# Patient Record
Sex: Female | Born: 1945 | Race: White | Hispanic: No | Marital: Married | Smoking: Never smoker
Health system: Southern US, Community
[De-identification: ages and names within clinical notes are randomized; demographics above are authoritative.]

## PROBLEM LIST (undated history)

## (undated) DIAGNOSIS — K449 Diaphragmatic hernia without obstruction or gangrene: Secondary | ICD-10-CM

## (undated) DIAGNOSIS — K5790 Diverticulosis of intestine, part unspecified, without perforation or abscess without bleeding: Secondary | ICD-10-CM

## (undated) DIAGNOSIS — M1990 Unspecified osteoarthritis, unspecified site: Secondary | ICD-10-CM

## (undated) DIAGNOSIS — K759 Inflammatory liver disease, unspecified: Secondary | ICD-10-CM

## (undated) DIAGNOSIS — K8020 Calculus of gallbladder without cholecystitis without obstruction: Secondary | ICD-10-CM

## (undated) DIAGNOSIS — M069 Rheumatoid arthritis, unspecified: Secondary | ICD-10-CM

## (undated) DIAGNOSIS — T7840XA Allergy, unspecified, initial encounter: Secondary | ICD-10-CM

## (undated) DIAGNOSIS — H409 Unspecified glaucoma: Secondary | ICD-10-CM

## (undated) DIAGNOSIS — K219 Gastro-esophageal reflux disease without esophagitis: Secondary | ICD-10-CM

## (undated) DIAGNOSIS — L4050 Arthropathic psoriasis, unspecified: Secondary | ICD-10-CM

## (undated) DIAGNOSIS — H269 Unspecified cataract: Secondary | ICD-10-CM

## (undated) HISTORY — DX: Allergy, unspecified, initial encounter: T78.40XA

## (undated) HISTORY — DX: Diverticulosis of intestine, part unspecified, without perforation or abscess without bleeding: K57.90

## (undated) HISTORY — DX: Arthropathic psoriasis, unspecified: L40.50

## (undated) HISTORY — DX: Inflammatory liver disease, unspecified: K75.9

## (undated) HISTORY — DX: Rheumatoid arthritis, unspecified: M06.9

## (undated) HISTORY — DX: Unspecified cataract: H26.9

## (undated) HISTORY — DX: Unspecified osteoarthritis, unspecified site: M19.90

## (undated) HISTORY — PX: DILATION AND CURETTAGE OF UTERUS: SHX78

## (undated) HISTORY — PX: COLONOSCOPY: SHX174

## (undated) HISTORY — PX: INTRAOCULAR LENS INSERTION: SHX110

## (undated) HISTORY — DX: Diaphragmatic hernia without obstruction or gangrene: K44.9

## (undated) HISTORY — DX: Gastro-esophageal reflux disease without esophagitis: K21.9

## (undated) HISTORY — PX: WISDOM TOOTH EXTRACTION: SHX21

## (undated) HISTORY — DX: Unspecified glaucoma: H40.9

## (undated) HISTORY — PX: BUNIONECTOMY: SHX129

## (undated) HISTORY — PX: EYE SURGERY: SHX253

## (undated) HISTORY — DX: Calculus of gallbladder without cholecystitis without obstruction: K80.20

---

## 1984-06-24 HISTORY — PX: TIBIA FRACTURE SURGERY: SHX806

## 1997-11-10 ENCOUNTER — Other Ambulatory Visit: Admission: RE | Admit: 1997-11-10 | Discharge: 1997-11-10 | Payer: Self-pay | Admitting: Gynecology

## 1999-03-20 ENCOUNTER — Other Ambulatory Visit: Admission: RE | Admit: 1999-03-20 | Discharge: 1999-03-20 | Payer: Self-pay | Admitting: Gynecology

## 2004-01-05 ENCOUNTER — Other Ambulatory Visit: Admission: RE | Admit: 2004-01-05 | Discharge: 2004-01-05 | Payer: Self-pay | Admitting: Gynecology

## 2004-10-02 ENCOUNTER — Ambulatory Visit: Payer: Self-pay | Admitting: Gastroenterology

## 2004-10-22 ENCOUNTER — Ambulatory Visit: Payer: Self-pay | Admitting: Gastroenterology

## 2009-01-26 ENCOUNTER — Encounter: Admission: RE | Admit: 2009-01-26 | Discharge: 2009-01-26 | Payer: Self-pay | Admitting: Gynecology

## 2009-06-24 DEATH — deceased

## 2010-04-04 ENCOUNTER — Encounter: Admission: RE | Admit: 2010-04-04 | Discharge: 2010-04-04 | Payer: Self-pay | Admitting: Gynecology

## 2010-12-24 ENCOUNTER — Emergency Department (HOSPITAL_COMMUNITY): Payer: BC Managed Care – PPO

## 2010-12-24 ENCOUNTER — Emergency Department (HOSPITAL_COMMUNITY)
Admission: EM | Admit: 2010-12-24 | Discharge: 2010-12-25 | Disposition: A | Discharge status: Home / Self Care | Payer: BC Managed Care – PPO | Attending: Emergency Medicine | Admitting: Emergency Medicine

## 2010-12-24 DIAGNOSIS — R61 Generalized hyperhidrosis: Secondary | ICD-10-CM | POA: Insufficient documentation

## 2010-12-24 DIAGNOSIS — R630 Anorexia: Secondary | ICD-10-CM | POA: Insufficient documentation

## 2010-12-24 DIAGNOSIS — R51 Headache: Secondary | ICD-10-CM | POA: Insufficient documentation

## 2010-12-24 DIAGNOSIS — R05 Cough: Secondary | ICD-10-CM | POA: Insufficient documentation

## 2010-12-24 DIAGNOSIS — R059 Cough, unspecified: Secondary | ICD-10-CM | POA: Insufficient documentation

## 2010-12-24 DIAGNOSIS — R42 Dizziness and giddiness: Secondary | ICD-10-CM | POA: Insufficient documentation

## 2010-12-24 DIAGNOSIS — M2540 Effusion, unspecified joint: Secondary | ICD-10-CM | POA: Insufficient documentation

## 2010-12-24 DIAGNOSIS — IMO0001 Reserved for inherently not codable concepts without codable children: Secondary | ICD-10-CM | POA: Insufficient documentation

## 2010-12-24 DIAGNOSIS — M2560 Stiffness of unspecified joint, not elsewhere classified: Secondary | ICD-10-CM | POA: Insufficient documentation

## 2010-12-24 DIAGNOSIS — R5381 Other malaise: Secondary | ICD-10-CM | POA: Insufficient documentation

## 2010-12-24 DIAGNOSIS — T148 Other injury of unspecified body region: Secondary | ICD-10-CM | POA: Insufficient documentation

## 2010-12-24 DIAGNOSIS — W57XXXA Bitten or stung by nonvenomous insect and other nonvenomous arthropods, initial encounter: Secondary | ICD-10-CM | POA: Insufficient documentation

## 2010-12-24 DIAGNOSIS — R071 Chest pain on breathing: Secondary | ICD-10-CM | POA: Insufficient documentation

## 2010-12-24 DIAGNOSIS — R509 Fever, unspecified: Secondary | ICD-10-CM | POA: Insufficient documentation

## 2010-12-24 LAB — CBC
MCHC: 33.8 g/dL (ref 30.0–36.0)
Platelets: 229 10*3/uL (ref 150–400)
RDW: 12.5 % (ref 11.5–15.5)
WBC: 11.2 10*3/uL — ABNORMAL HIGH (ref 4.0–10.5)

## 2010-12-24 LAB — DIFFERENTIAL
Basophils Absolute: 0.1 10*3/uL (ref 0.0–0.1)
Basophils Relative: 1 % (ref 0–1)
Eosinophils Relative: 3 % (ref 0–5)
Monocytes Absolute: 1.2 10*3/uL — ABNORMAL HIGH (ref 0.1–1.0)
Neutro Abs: 6.8 10*3/uL (ref 1.7–7.7)

## 2010-12-24 LAB — BASIC METABOLIC PANEL
BUN: 15 mg/dL (ref 6–23)
Calcium: 8.8 mg/dL (ref 8.4–10.5)
Creatinine, Ser: 0.67 mg/dL (ref 0.50–1.10)
GFR calc non Af Amer: >60 mL/min (ref >60)
Glucose, Bld: 95 mg/dL (ref 70–99)

## 2010-12-24 LAB — URINE MICROSCOPIC-ADD ON

## 2010-12-24 LAB — CK TOTAL AND CKMB (NOT AT ARMC)
CK, MB: 2.7 ng/mL (ref 0.3–4.0)
Total CK: 75 U/L (ref 7–177)

## 2010-12-24 LAB — URINALYSIS, ROUTINE W REFLEX MICROSCOPIC
Bilirubin Urine: NEGATIVE
Ketones, ur: NEGATIVE mg/dL
Protein, ur: NEGATIVE mg/dL
Specific Gravity, Urine: 1.028 (ref 1.005–1.030)
Urobilinogen, UA: 0.2 mg/dL (ref 0.0–1.0)

## 2010-12-25 LAB — URINE CULTURE

## 2011-07-01 DIAGNOSIS — M159 Polyosteoarthritis, unspecified: Secondary | ICD-10-CM | POA: Diagnosis not present

## 2011-07-01 DIAGNOSIS — L4050 Arthropathic psoriasis, unspecified: Secondary | ICD-10-CM | POA: Diagnosis not present

## 2011-07-01 DIAGNOSIS — L408 Other psoriasis: Secondary | ICD-10-CM | POA: Diagnosis not present

## 2011-07-23 DIAGNOSIS — L4050 Arthropathic psoriasis, unspecified: Secondary | ICD-10-CM | POA: Diagnosis not present

## 2011-08-13 DIAGNOSIS — L259 Unspecified contact dermatitis, unspecified cause: Secondary | ICD-10-CM | POA: Diagnosis not present

## 2011-08-13 DIAGNOSIS — L981 Factitial dermatitis: Secondary | ICD-10-CM | POA: Diagnosis not present

## 2011-09-05 DIAGNOSIS — L4050 Arthropathic psoriasis, unspecified: Secondary | ICD-10-CM | POA: Diagnosis not present

## 2011-10-15 DIAGNOSIS — L4050 Arthropathic psoriasis, unspecified: Secondary | ICD-10-CM | POA: Diagnosis not present

## 2011-10-15 DIAGNOSIS — L408 Other psoriasis: Secondary | ICD-10-CM | POA: Diagnosis not present

## 2011-10-15 DIAGNOSIS — M159 Polyosteoarthritis, unspecified: Secondary | ICD-10-CM | POA: Diagnosis not present

## 2011-11-26 DIAGNOSIS — L4050 Arthropathic psoriasis, unspecified: Secondary | ICD-10-CM | POA: Diagnosis not present

## 2012-01-07 DIAGNOSIS — L4050 Arthropathic psoriasis, unspecified: Secondary | ICD-10-CM | POA: Diagnosis not present

## 2012-02-18 DIAGNOSIS — L4050 Arthropathic psoriasis, unspecified: Secondary | ICD-10-CM | POA: Diagnosis not present

## 2012-03-10 DIAGNOSIS — M719 Bursopathy, unspecified: Secondary | ICD-10-CM | POA: Diagnosis not present

## 2012-03-10 DIAGNOSIS — L4050 Arthropathic psoriasis, unspecified: Secondary | ICD-10-CM | POA: Diagnosis not present

## 2012-03-10 DIAGNOSIS — M159 Polyosteoarthritis, unspecified: Secondary | ICD-10-CM | POA: Diagnosis not present

## 2012-03-10 DIAGNOSIS — M67919 Unspecified disorder of synovium and tendon, unspecified shoulder: Secondary | ICD-10-CM | POA: Diagnosis not present

## 2012-03-10 DIAGNOSIS — L408 Other psoriasis: Secondary | ICD-10-CM | POA: Diagnosis not present

## 2012-03-31 DIAGNOSIS — M069 Rheumatoid arthritis, unspecified: Secondary | ICD-10-CM | POA: Diagnosis not present

## 2012-03-31 DIAGNOSIS — Z23 Encounter for immunization: Secondary | ICD-10-CM | POA: Diagnosis not present

## 2012-05-12 DIAGNOSIS — M2550 Pain in unspecified joint: Secondary | ICD-10-CM | POA: Diagnosis not present

## 2012-05-12 DIAGNOSIS — L4050 Arthropathic psoriasis, unspecified: Secondary | ICD-10-CM | POA: Diagnosis not present

## 2012-06-23 DIAGNOSIS — L4050 Arthropathic psoriasis, unspecified: Secondary | ICD-10-CM | POA: Diagnosis not present

## 2012-07-07 DIAGNOSIS — M159 Polyosteoarthritis, unspecified: Secondary | ICD-10-CM | POA: Diagnosis not present

## 2012-07-07 DIAGNOSIS — M79609 Pain in unspecified limb: Secondary | ICD-10-CM | POA: Diagnosis not present

## 2012-07-07 DIAGNOSIS — L4050 Arthropathic psoriasis, unspecified: Secondary | ICD-10-CM | POA: Diagnosis not present

## 2012-07-07 DIAGNOSIS — M67919 Unspecified disorder of synovium and tendon, unspecified shoulder: Secondary | ICD-10-CM | POA: Diagnosis not present

## 2012-07-07 DIAGNOSIS — L408 Other psoriasis: Secondary | ICD-10-CM | POA: Diagnosis not present

## 2012-07-07 DIAGNOSIS — M719 Bursopathy, unspecified: Secondary | ICD-10-CM | POA: Diagnosis not present

## 2012-08-04 DIAGNOSIS — L4050 Arthropathic psoriasis, unspecified: Secondary | ICD-10-CM | POA: Diagnosis not present

## 2012-08-27 DIAGNOSIS — H2510 Age-related nuclear cataract, unspecified eye: Secondary | ICD-10-CM | POA: Diagnosis not present

## 2012-09-08 DIAGNOSIS — H2510 Age-related nuclear cataract, unspecified eye: Secondary | ICD-10-CM | POA: Diagnosis not present

## 2012-09-10 DIAGNOSIS — H269 Unspecified cataract: Secondary | ICD-10-CM | POA: Diagnosis not present

## 2012-09-10 DIAGNOSIS — H2510 Age-related nuclear cataract, unspecified eye: Secondary | ICD-10-CM | POA: Diagnosis not present

## 2012-09-10 DIAGNOSIS — IMO0002 Reserved for concepts with insufficient information to code with codable children: Secondary | ICD-10-CM | POA: Diagnosis not present

## 2012-10-05 DIAGNOSIS — L4050 Arthropathic psoriasis, unspecified: Secondary | ICD-10-CM | POA: Diagnosis not present

## 2012-11-17 DIAGNOSIS — L4050 Arthropathic psoriasis, unspecified: Secondary | ICD-10-CM | POA: Diagnosis not present

## 2013-01-05 DIAGNOSIS — L4050 Arthropathic psoriasis, unspecified: Secondary | ICD-10-CM | POA: Diagnosis not present

## 2013-01-11 DIAGNOSIS — M159 Polyosteoarthritis, unspecified: Secondary | ICD-10-CM | POA: Diagnosis not present

## 2013-01-11 DIAGNOSIS — M719 Bursopathy, unspecified: Secondary | ICD-10-CM | POA: Diagnosis not present

## 2013-01-11 DIAGNOSIS — L4050 Arthropathic psoriasis, unspecified: Secondary | ICD-10-CM | POA: Diagnosis not present

## 2013-01-11 DIAGNOSIS — M67919 Unspecified disorder of synovium and tendon, unspecified shoulder: Secondary | ICD-10-CM | POA: Diagnosis not present

## 2013-01-11 DIAGNOSIS — L408 Other psoriasis: Secondary | ICD-10-CM | POA: Diagnosis not present

## 2013-01-19 DIAGNOSIS — B079 Viral wart, unspecified: Secondary | ICD-10-CM | POA: Diagnosis not present

## 2013-02-15 DIAGNOSIS — L4050 Arthropathic psoriasis, unspecified: Secondary | ICD-10-CM | POA: Diagnosis not present

## 2013-03-04 DIAGNOSIS — M899 Disorder of bone, unspecified: Secondary | ICD-10-CM | POA: Diagnosis not present

## 2013-03-04 DIAGNOSIS — Z1231 Encounter for screening mammogram for malignant neoplasm of breast: Secondary | ICD-10-CM | POA: Diagnosis not present

## 2013-03-18 DIAGNOSIS — Z1289 Encounter for screening for malignant neoplasm of other sites: Secondary | ICD-10-CM | POA: Diagnosis not present

## 2013-03-18 DIAGNOSIS — Z1212 Encounter for screening for malignant neoplasm of rectum: Secondary | ICD-10-CM | POA: Diagnosis not present

## 2013-03-30 DIAGNOSIS — Z23 Encounter for immunization: Secondary | ICD-10-CM | POA: Diagnosis not present

## 2013-03-30 DIAGNOSIS — L4050 Arthropathic psoriasis, unspecified: Secondary | ICD-10-CM | POA: Diagnosis not present

## 2013-05-04 DIAGNOSIS — L4050 Arthropathic psoriasis, unspecified: Secondary | ICD-10-CM | POA: Diagnosis not present

## 2013-06-08 DIAGNOSIS — L4050 Arthropathic psoriasis, unspecified: Secondary | ICD-10-CM | POA: Diagnosis not present

## 2013-06-22 DIAGNOSIS — L408 Other psoriasis: Secondary | ICD-10-CM | POA: Diagnosis not present

## 2013-06-22 DIAGNOSIS — M67919 Unspecified disorder of synovium and tendon, unspecified shoulder: Secondary | ICD-10-CM | POA: Diagnosis not present

## 2013-06-22 DIAGNOSIS — L4050 Arthropathic psoriasis, unspecified: Secondary | ICD-10-CM | POA: Diagnosis not present

## 2013-06-22 DIAGNOSIS — M159 Polyosteoarthritis, unspecified: Secondary | ICD-10-CM | POA: Diagnosis not present

## 2013-07-13 DIAGNOSIS — L4050 Arthropathic psoriasis, unspecified: Secondary | ICD-10-CM | POA: Diagnosis not present

## 2013-08-31 DIAGNOSIS — L4050 Arthropathic psoriasis, unspecified: Secondary | ICD-10-CM | POA: Diagnosis not present

## 2013-10-19 DIAGNOSIS — M159 Polyosteoarthritis, unspecified: Secondary | ICD-10-CM | POA: Diagnosis not present

## 2013-10-19 DIAGNOSIS — M719 Bursopathy, unspecified: Secondary | ICD-10-CM | POA: Diagnosis not present

## 2013-10-19 DIAGNOSIS — L408 Other psoriasis: Secondary | ICD-10-CM | POA: Diagnosis not present

## 2013-10-19 DIAGNOSIS — M67919 Unspecified disorder of synovium and tendon, unspecified shoulder: Secondary | ICD-10-CM | POA: Diagnosis not present

## 2013-10-19 DIAGNOSIS — L4050 Arthropathic psoriasis, unspecified: Secondary | ICD-10-CM | POA: Diagnosis not present

## 2013-10-21 DIAGNOSIS — L4050 Arthropathic psoriasis, unspecified: Secondary | ICD-10-CM | POA: Diagnosis not present

## 2013-11-30 DIAGNOSIS — L4050 Arthropathic psoriasis, unspecified: Secondary | ICD-10-CM | POA: Diagnosis not present

## 2014-01-11 DIAGNOSIS — L4050 Arthropathic psoriasis, unspecified: Secondary | ICD-10-CM | POA: Diagnosis not present

## 2014-02-15 DIAGNOSIS — L4050 Arthropathic psoriasis, unspecified: Secondary | ICD-10-CM | POA: Diagnosis not present

## 2014-02-15 DIAGNOSIS — L408 Other psoriasis: Secondary | ICD-10-CM | POA: Diagnosis not present

## 2014-02-15 DIAGNOSIS — M67919 Unspecified disorder of synovium and tendon, unspecified shoulder: Secondary | ICD-10-CM | POA: Diagnosis not present

## 2014-02-15 DIAGNOSIS — M719 Bursopathy, unspecified: Secondary | ICD-10-CM | POA: Diagnosis not present

## 2014-02-15 DIAGNOSIS — M159 Polyosteoarthritis, unspecified: Secondary | ICD-10-CM | POA: Diagnosis not present

## 2014-02-22 DIAGNOSIS — L4050 Arthropathic psoriasis, unspecified: Secondary | ICD-10-CM | POA: Diagnosis not present

## 2014-03-16 DIAGNOSIS — Z1231 Encounter for screening mammogram for malignant neoplasm of breast: Secondary | ICD-10-CM | POA: Diagnosis not present

## 2014-03-21 DIAGNOSIS — N951 Menopausal and female climacteric states: Secondary | ICD-10-CM | POA: Diagnosis not present

## 2014-03-21 DIAGNOSIS — Z124 Encounter for screening for malignant neoplasm of cervix: Secondary | ICD-10-CM | POA: Diagnosis not present

## 2014-03-21 DIAGNOSIS — M810 Age-related osteoporosis without current pathological fracture: Secondary | ICD-10-CM | POA: Diagnosis not present

## 2014-03-21 DIAGNOSIS — Z1212 Encounter for screening for malignant neoplasm of rectum: Secondary | ICD-10-CM | POA: Diagnosis not present

## 2014-04-06 DIAGNOSIS — L4050 Arthropathic psoriasis, unspecified: Secondary | ICD-10-CM | POA: Diagnosis not present

## 2014-04-06 DIAGNOSIS — Z23 Encounter for immunization: Secondary | ICD-10-CM | POA: Diagnosis not present

## 2014-05-18 DIAGNOSIS — L4050 Arthropathic psoriasis, unspecified: Secondary | ICD-10-CM | POA: Diagnosis not present

## 2014-05-31 DIAGNOSIS — L720 Epidermal cyst: Secondary | ICD-10-CM | POA: Diagnosis not present

## 2014-05-31 DIAGNOSIS — L300 Nummular dermatitis: Secondary | ICD-10-CM | POA: Diagnosis not present

## 2014-05-31 DIAGNOSIS — D485 Neoplasm of uncertain behavior of skin: Secondary | ICD-10-CM | POA: Diagnosis not present

## 2014-05-31 DIAGNOSIS — L820 Inflamed seborrheic keratosis: Secondary | ICD-10-CM | POA: Diagnosis not present

## 2014-06-22 DIAGNOSIS — M150 Primary generalized (osteo)arthritis: Secondary | ICD-10-CM | POA: Diagnosis not present

## 2014-06-22 DIAGNOSIS — M79643 Pain in unspecified hand: Secondary | ICD-10-CM | POA: Diagnosis not present

## 2014-06-22 DIAGNOSIS — L4050 Arthropathic psoriasis, unspecified: Secondary | ICD-10-CM | POA: Diagnosis not present

## 2014-06-22 DIAGNOSIS — L409 Psoriasis, unspecified: Secondary | ICD-10-CM | POA: Diagnosis not present

## 2014-06-22 DIAGNOSIS — M899 Disorder of bone, unspecified: Secondary | ICD-10-CM | POA: Diagnosis not present

## 2014-06-28 DIAGNOSIS — L4050 Arthropathic psoriasis, unspecified: Secondary | ICD-10-CM | POA: Diagnosis not present

## 2014-07-12 DIAGNOSIS — H2511 Age-related nuclear cataract, right eye: Secondary | ICD-10-CM | POA: Diagnosis not present

## 2014-07-12 DIAGNOSIS — H18411 Arcus senilis, right eye: Secondary | ICD-10-CM | POA: Diagnosis not present

## 2014-07-12 DIAGNOSIS — H02839 Dermatochalasis of unspecified eye, unspecified eyelid: Secondary | ICD-10-CM | POA: Diagnosis not present

## 2014-07-12 DIAGNOSIS — Z961 Presence of intraocular lens: Secondary | ICD-10-CM | POA: Diagnosis not present

## 2014-07-19 DIAGNOSIS — L821 Other seborrheic keratosis: Secondary | ICD-10-CM | POA: Diagnosis not present

## 2014-07-19 DIAGNOSIS — L720 Epidermal cyst: Secondary | ICD-10-CM | POA: Diagnosis not present

## 2014-07-19 DIAGNOSIS — L570 Actinic keratosis: Secondary | ICD-10-CM | POA: Diagnosis not present

## 2014-07-19 DIAGNOSIS — L814 Other melanin hyperpigmentation: Secondary | ICD-10-CM | POA: Diagnosis not present

## 2014-08-09 DIAGNOSIS — L4050 Arthropathic psoriasis, unspecified: Secondary | ICD-10-CM | POA: Diagnosis not present

## 2014-09-05 DIAGNOSIS — H2589 Other age-related cataract: Secondary | ICD-10-CM | POA: Diagnosis not present

## 2014-09-05 DIAGNOSIS — H25811 Combined forms of age-related cataract, right eye: Secondary | ICD-10-CM | POA: Diagnosis not present

## 2014-09-05 DIAGNOSIS — H2511 Age-related nuclear cataract, right eye: Secondary | ICD-10-CM | POA: Diagnosis not present

## 2014-09-20 DIAGNOSIS — L4050 Arthropathic psoriasis, unspecified: Secondary | ICD-10-CM | POA: Diagnosis not present

## 2014-09-20 DIAGNOSIS — M150 Primary generalized (osteo)arthritis: Secondary | ICD-10-CM | POA: Diagnosis not present

## 2014-10-18 DIAGNOSIS — M150 Primary generalized (osteo)arthritis: Secondary | ICD-10-CM | POA: Diagnosis not present

## 2014-10-18 DIAGNOSIS — L4050 Arthropathic psoriasis, unspecified: Secondary | ICD-10-CM | POA: Diagnosis not present

## 2014-10-18 DIAGNOSIS — L409 Psoriasis, unspecified: Secondary | ICD-10-CM | POA: Diagnosis not present

## 2014-11-01 DIAGNOSIS — L4050 Arthropathic psoriasis, unspecified: Secondary | ICD-10-CM | POA: Diagnosis not present

## 2014-12-12 DIAGNOSIS — L4050 Arthropathic psoriasis, unspecified: Secondary | ICD-10-CM | POA: Diagnosis not present

## 2015-01-24 DIAGNOSIS — L4050 Arthropathic psoriasis, unspecified: Secondary | ICD-10-CM | POA: Diagnosis not present

## 2015-03-02 DIAGNOSIS — L4050 Arthropathic psoriasis, unspecified: Secondary | ICD-10-CM | POA: Diagnosis not present

## 2015-03-02 DIAGNOSIS — M25532 Pain in left wrist: Secondary | ICD-10-CM | POA: Diagnosis not present

## 2015-03-02 DIAGNOSIS — L409 Psoriasis, unspecified: Secondary | ICD-10-CM | POA: Diagnosis not present

## 2015-03-02 DIAGNOSIS — W19XXXA Unspecified fall, initial encounter: Secondary | ICD-10-CM | POA: Diagnosis not present

## 2015-03-02 DIAGNOSIS — M25522 Pain in left elbow: Secondary | ICD-10-CM | POA: Diagnosis not present

## 2015-03-02 DIAGNOSIS — M25531 Pain in right wrist: Secondary | ICD-10-CM | POA: Diagnosis not present

## 2015-03-02 DIAGNOSIS — Z79899 Other long term (current) drug therapy: Secondary | ICD-10-CM | POA: Diagnosis not present

## 2015-03-02 DIAGNOSIS — Y92009 Unspecified place in unspecified non-institutional (private) residence as the place of occurrence of the external cause: Secondary | ICD-10-CM | POA: Diagnosis not present

## 2015-03-02 DIAGNOSIS — M150 Primary generalized (osteo)arthritis: Secondary | ICD-10-CM | POA: Diagnosis not present

## 2015-03-14 ENCOUNTER — Emergency Department (HOSPITAL_COMMUNITY)
Admission: EM | Admit: 2015-03-14 | Discharge: 2015-03-14 | Disposition: A | Discharge status: Home / Self Care | Payer: Medicare Other | Attending: Emergency Medicine | Admitting: Emergency Medicine

## 2015-03-14 ENCOUNTER — Encounter (HOSPITAL_COMMUNITY): Payer: Self-pay | Admitting: Emergency Medicine

## 2015-03-14 ENCOUNTER — Emergency Department (HOSPITAL_COMMUNITY): Payer: Medicare Other

## 2015-03-14 DIAGNOSIS — K449 Diaphragmatic hernia without obstruction or gangrene: Secondary | ICD-10-CM | POA: Diagnosis not present

## 2015-03-14 DIAGNOSIS — R1013 Epigastric pain: Secondary | ICD-10-CM | POA: Diagnosis present

## 2015-03-14 DIAGNOSIS — R1011 Right upper quadrant pain: Secondary | ICD-10-CM

## 2015-03-14 DIAGNOSIS — M1990 Unspecified osteoarthritis, unspecified site: Secondary | ICD-10-CM | POA: Diagnosis not present

## 2015-03-14 DIAGNOSIS — K8020 Calculus of gallbladder without cholecystitis without obstruction: Secondary | ICD-10-CM | POA: Diagnosis not present

## 2015-03-14 DIAGNOSIS — Z791 Long term (current) use of non-steroidal anti-inflammatories (NSAID): Secondary | ICD-10-CM | POA: Insufficient documentation

## 2015-03-14 LAB — CBC WITH DIFFERENTIAL/PLATELET
BASOS ABS: 0 10*3/uL (ref 0.0–0.1)
BASOS PCT: 0 %
EOS ABS: 0.1 10*3/uL (ref 0.0–0.7)
EOS PCT: 1 %
HCT: 41 % (ref 36.0–46.0)
HEMOGLOBIN: 13.9 g/dL (ref 12.0–15.0)
LYMPHS ABS: 1.9 10*3/uL (ref 0.7–4.0)
Lymphocytes Relative: 22 %
MCH: 32 pg (ref 26.0–34.0)
MCHC: 33.9 g/dL (ref 30.0–36.0)
MCV: 94.3 fL (ref 78.0–100.0)
Monocytes Absolute: 0.5 10*3/uL (ref 0.1–1.0)
Monocytes Relative: 6 %
NEUTROS PCT: 71 %
Neutro Abs: 6.3 10*3/uL (ref 1.7–7.7)
PLATELETS: 253 10*3/uL (ref 150–400)
RBC: 4.35 MIL/uL (ref 3.87–5.11)
RDW: 12.4 % (ref 11.5–15.5)
WBC: 8.9 10*3/uL (ref 4.0–10.5)

## 2015-03-14 LAB — COMPREHENSIVE METABOLIC PANEL
ALT: 17 U/L (ref 14–54)
AST: 24 U/L (ref 15–41)
Albumin: 3.9 g/dL (ref 3.5–5.0)
Alkaline Phosphatase: 67 U/L (ref 38–126)
Anion gap: 8 (ref 5–15)
BILIRUBIN TOTAL: 0.7 mg/dL (ref 0.3–1.2)
BUN: 12 mg/dL (ref 6–20)
CHLORIDE: 103 mmol/L (ref 101–111)
CO2: 26 mmol/L (ref 22–32)
CREATININE: 0.66 mg/dL (ref 0.44–1.00)
Calcium: 8.8 mg/dL — ABNORMAL LOW (ref 8.9–10.3)
Glucose, Bld: 115 mg/dL — ABNORMAL HIGH (ref 65–99)
Potassium: 3.6 mmol/L (ref 3.5–5.1)
Sodium: 137 mmol/L (ref 135–145)
TOTAL PROTEIN: 7.4 g/dL (ref 6.5–8.1)

## 2015-03-14 LAB — URINE MICROSCOPIC-ADD ON

## 2015-03-14 LAB — URINALYSIS, ROUTINE W REFLEX MICROSCOPIC
Bilirubin Urine: NEGATIVE
GLUCOSE, UA: NEGATIVE mg/dL
KETONES UR: NEGATIVE mg/dL
LEUKOCYTES UA: NEGATIVE
NITRITE: NEGATIVE
PROTEIN: NEGATIVE mg/dL
Specific Gravity, Urine: 1.015 (ref 1.005–1.030)
UROBILINOGEN UA: 0.2 mg/dL (ref 0.0–1.0)
pH: 5 (ref 5.0–8.0)

## 2015-03-14 LAB — LIPASE, BLOOD: LIPASE: 23 U/L (ref 22–51)

## 2015-03-14 LAB — TROPONIN I

## 2015-03-14 MED ORDER — ONDANSETRON HCL 4 MG/2ML IJ SOLN
4.0000 mg | Freq: Once | INTRAMUSCULAR | Status: AC
  Administered 2015-03-14: 4 mg via INTRAVENOUS
  Filled 2015-03-14: qty 2

## 2015-03-14 MED ORDER — IOHEXOL 300 MG/ML  SOLN
100.0000 mL | Freq: Once | INTRAMUSCULAR | Status: AC | PRN
  Administered 2015-03-14: 100 mL via INTRAVENOUS

## 2015-03-14 MED ORDER — PANTOPRAZOLE SODIUM 40 MG IV SOLR
40.0000 mg | Freq: Once | INTRAVENOUS | Status: AC
  Administered 2015-03-14: 40 mg via INTRAVENOUS
  Filled 2015-03-14: qty 40

## 2015-03-14 MED ORDER — GI COCKTAIL ~~LOC~~
30.0000 mL | Freq: Once | ORAL | Status: AC
  Administered 2015-03-14: 30 mL via ORAL
  Filled 2015-03-14: qty 30

## 2015-03-14 MED ORDER — MORPHINE SULFATE (PF) 4 MG/ML IV SOLN
4.0000 mg | Freq: Once | INTRAVENOUS | Status: AC
  Administered 2015-03-14: 4 mg via INTRAVENOUS
  Filled 2015-03-14: qty 1

## 2015-03-14 MED ORDER — IOHEXOL 300 MG/ML  SOLN
25.0000 mL | Freq: Once | INTRAMUSCULAR | Status: AC | PRN
  Administered 2015-03-14: 25 mL via ORAL

## 2015-03-14 MED ORDER — PANTOPRAZOLE SODIUM 20 MG PO TBEC: 20.0000 mg | DELAYED_RELEASE_TABLET | Freq: Every day | ORAL | Status: AC

## 2015-03-14 MED ORDER — SUCRALFATE 1 G PO TABS: 1.0000 g | ORAL_TABLET | Freq: Three times a day (TID) | ORAL | Status: DC

## 2015-03-14 NOTE — ED Notes (Signed)
Pt sts upper abd pain through to back and into shoulders starting last night; pt sts episodes similar in past and thinks from gall bladder

## 2015-03-14 NOTE — ED Notes (Signed)
Pt stable, ambulatory, states understanding of discharge instructions 

## 2015-03-14 NOTE — Discharge Instructions (Signed)
Abdominal Pain Follow-up with the stomach doctors regarding your epigastric pain and the surgeons regarding her gallbladder. Return to the ED if he develop new or worsening symptoms. Avoid alcohol, and Inflammatory Medications, Caffeine and Spicy Foods. Many things can cause abdominal pain. Usually, abdominal pain is not caused by a disease and will improve without treatment. It can often be observed and treated at home. Your health care provider will do a physical exam and possibly order blood tests and X-rays to help determine the seriousness of your pain. However, in many cases, more time must pass before a clear cause of the pain can be found. Before that point, your health care provider may not know if you need more testing or further treatment. HOME CARE INSTRUCTIONS  Monitor your abdominal pain for any changes. The following actions may help to alleviate any discomfort you are experiencing:  Only take over-the-counter or prescription medicines as directed by your health care provider.  Do not take laxatives unless directed to do so by your health care provider.  Try a clear liquid diet (broth, tea, or water) as directed by your health care provider. Slowly move to a bland diet as tolerated. SEEK MEDICAL CARE IF:  You have unexplained abdominal pain.  You have abdominal pain associated with nausea or diarrhea.  You have pain when you urinate or have a bowel movement.  You experience abdominal pain that wakes you in the night.  You have abdominal pain that is worsened or improved by eating food.  You have abdominal pain that is worsened with eating fatty foods.  You have a fever. SEEK IMMEDIATE MEDICAL CARE IF:   Your pain does not go away within 2 hours.  You keep throwing up (vomiting).  Your pain is felt only in portions of the abdomen, such as the right side or the left lower portion of the abdomen.  You pass bloody or black tarry stools. MAKE SURE YOU:  Understand these  instructions.   Will watch your condition.   Will get help right away if you are not doing well or get worse.  Document Released: 03/20/2005 Document Revised: 06/15/2013 Document Reviewed: 02/17/2013 [facility] Patient Information 2015 [place], [place]. This information is not intended to replace advice given to you by your health care provider. Make sure you discuss any questions you have with your health care provider.

## 2015-03-14 NOTE — ED Provider Notes (Signed)
CSN: [id number]     Arrival date & time 03/14/15  1115 History   First MD Initiated Contact with Patient 03/14/15 1448     Chief Complaint  Patient presents with  . Abdominal Pain     (Consider location/radiation/quality/duration/timing/severity/associated sxs/prior Treatment) HPI Comments: Epigastric pain that has been constant since last night, radiating into back.  Has felt nauseated bu not vomited.  Pain is constant, nothing makes it better. Worse with palpation. No appetite.  Has had similar pain on and off for years which she attributed to her gallbladder but has not had any testing.  It has never been this bad.  No fever or urinary symptoms.  No chest pain or SOB. No cardiac history.  No focal weakness, numbness, tingling.  Hx rheumatoid arthritis on chronic NSAIDs and remicade infusion.  Denies any blood in stool.  Hx GERD but this is different.  The history is provided by the patient.    Past Medical History  Diagnosis Date  . Arthritis    History reviewed. No pertinent past surgical history. History reviewed. No pertinent family history. Social History  Substance Use Topics  . Smoking status: Never Smoker   . Smokeless tobacco: None  . Alcohol Use: No   OB History    No data available     Review of Systems  Constitutional: Positive for activity change, appetite change and fatigue. Negative for fever.  HENT: Negative for congestion.   Respiratory: Negative for cough, chest tightness and shortness of breath.   Cardiovascular: Negative for chest pain.  Gastrointestinal: Positive for nausea and abdominal pain. Negative for vomiting and constipation.  Genitourinary: Negative for dysuria, hematuria, vaginal bleeding and vaginal discharge.  Musculoskeletal: Negative for myalgias and arthralgias.  Skin: Negative for pallor.  Neurological: Negative for dizziness, weakness and headaches.  A complete 10 system review of systems was obtained and all systems are negative except  as noted in the HPI and PMH.      Allergies  Review of patient's allergies indicates no known allergies.  Home Medications   Prior to Admission medications   Medication Sig Start Date End Date Taking? Authorizing Provider  Doxylamine Succinate, Sleep, (SLEEP AID PO) Take 1 tablet by mouth at bedtime as needed (sleep).   Yes Historical Provider, MD  InFLIXimab (REMICADE IV) Inject into the vein. Patient gets every 8 weeks. Followed by Dr Stark Jock.   Yes Historical Provider, MD  meloxicam (MOBIC) 15 MG tablet Take 15 mg by mouth daily.   Yes Historical Provider, MD  traMADol (ULTRAM) 50 MG tablet Take 50 mg by mouth every 6 (six) hours as needed.   Yes Historical Provider, MD  pantoprazole (PROTONIX) 20 MG tablet Take 1 tablet (20 mg total) by mouth daily. 03/14/15   Ezequiel Essex, MD  sucralfate (CARAFATE) 1 G tablet Take 1 tablet (1 g total) by mouth 4 (four) times daily -  with meals and at bedtime. 03/14/15   Ezequiel Essex, MD   BP 150/76 mmHg  Pulse 81  Temp(Src) 98.5 F (36.9 C) (Oral)  Resp 18  SpO2 91% Physical Exam  Constitutional: She is oriented to person, place, and time. She appears well-developed and well-nourished. No distress.  HENT:  Head: Normocephalic and atraumatic.  Mouth/Throat: Oropharynx is clear and moist. No oropharyngeal exudate.  Eyes: Conjunctivae and EOM are normal. Pupils are equal, round, and reactive to light.  Neck: Normal range of motion. Neck supple.  No meningismus.  Cardiovascular: Normal rate, regular rhythm, normal heart sounds  and intact distal pulses.   No murmur heard. Pulmonary/Chest: Effort normal and breath sounds normal. No respiratory distress.  Abdominal: Soft. There is tenderness. There is no rebound and no guarding.  TTP epigastrium and RUQ , no guarding or rebound  Musculoskeletal: Normal range of motion. She exhibits no edema or tenderness.  Neurological: She is alert and oriented to person, place, and time. No cranial nerve  deficit. She exhibits normal muscle tone. Coordination normal.  No ataxia on finger to nose bilaterally. No pronator drift. 5/5 strength throughout. CN 2-12 intact. Negative Romberg. Equal grip strength. Sensation intact. Gait is normal.   Skin: Skin is warm.  Psychiatric: She has a normal mood and affect. Her behavior is normal.  Nursing note and vitals reviewed.   ED Course  Procedures (including critical care time) Labs Review Labs Reviewed  COMPREHENSIVE METABOLIC PANEL - Abnormal; Notable for the following:    Glucose, Bld 115 (*)    Calcium 8.8 (*)    All other components within normal limits  URINALYSIS, ROUTINE W REFLEX MICROSCOPIC (NOT AT [facility]) - Abnormal; Notable for the following:    Hgb urine dipstick SMALL (*)    All other components within normal limits  LIPASE, BLOOD  CBC WITH DIFFERENTIAL/PLATELET  TROPONIN I  URINE MICROSCOPIC-ADD ON    Imaging Review Ct Abdomen Pelvis W Contrast  03/14/2015   CLINICAL DATA:  Pt states she has upper abd pain through to back and into shoulders starting last night. The patient states episodes similar in past and thinks from gall bladder. No h/o bowel diseases. H/o C-section x2.  EXAM: CT ABDOMEN AND PELVIS WITH CONTRAST  TECHNIQUE: Multidetector CT imaging of the abdomen and pelvis was performed using the standard protocol following bolus administration of intravenous contrast.  CONTRAST:  186mL OMNIPAQUE IOHEXOL 300 MG/ML  SOLN  COMPARISON:  Ultrasound 03/14/2015  FINDINGS: Lower chest: Large hiatal hernia is present. No pulmonary nodules, pleural effusions, or infiltrates. Heart size is normal. No imaged pericardial effusion or significant coronary artery calcifications.  Upper abdomen: Calcified gallstone in the gallbladder is 2.9 cm. Gallbladder is mildly distended. Common bile duct has a normal appearance. No focal abnormality identified within the liver, spleen, pancreas, or adrenal glands. Kidneys are symmetric and enhancement and  excretion. No hydronephrosis.  Gastrointestinal tract: The stomach and small bowel loops are normal in appearance. The appendix is well seen and has a normal appearance. There is significant colonic diverticulosis particularly involving the sigmoid colon. No associated inflammatory change to indicate acute diverticulitis.  Pelvis: The uterus is present. No adnexal mass. No free pelvic fluid.  Retroperitoneum: There is mild atherosclerotic calcification of the abdominal aorta. No aneurysm. No retroperitoneal or mesenteric adenopathy.  Abdominal wall: Unremarkable.  Osseous structures: There is 11 mm anterolisthesis of L5 on S1 associated with vacuum disc and bilateral L5 pars defects. No acute fracture or traumatic subluxation.  IMPRESSION: 1. Large hiatal hernia. 2. Calcified gallstones 2.9 cm without other CT features of acute cholecystitis. 3. Normal appendix. 4. Significant colonic diverticulosis.  No associated inflammation. 5. Mild abdominal aortic atherosclerosis. 6. Grade 1 anterolisthesis of L5 on S1 associated with bilateral L5 pars defects.   Electronically Signed   By: Nolon Nations M.D.   On: 03/14/2015 18:23   Dg Abd Acute W/chest  03/14/2015   CLINICAL DATA:  epigastric pain through to back and in between shoulders starting last night; pt sts episodes similar in past but not this severe  EXAM: DG ABDOMEN ACUTE W/  1V CHEST  COMPARISON:  12/24/2010  FINDINGS: Lungs clear.  Heart size normal.  Atheromatous aorta.  No effusion.  No free air. Normal bowel gas pattern.  Regional bones unremarkable.  IMPRESSION: No acute cardiopulmonary disease.  Negative abdominal radiographs.   Electronically Signed   By: Lucrezia Europe M.D.   On: 03/14/2015 16:00   US Abdomen Limited Ruq  03/14/2015   CLINICAL DATA:  Epigastric pain for 1 day  EXAM: US ABDOMEN LIMITED - RIGHT UPPER QUADRANT  COMPARISON:  None.  FINDINGS: Gallbladder:  There multiple gallstones within the gallbladder. No wall thickening visualized. No  sonographic Murphy sign noted.  Common bile duct:  Diameter: 3.5 mm  Liver:  No focal lesion identified. There is diffuse increased echotexture of the liver.  IMPRESSION: Cholelithiasis without sonographic evidence of acute cholecystitis.  Fatty infiltration of liver.   Electronically Signed   By: Abelardo Diesel M.D.   On: 03/14/2015 16:20   I have personally reviewed and evaluated these images and lab results as part of my medical decision-making.   EKG Interpretation   Date/Time:  Tuesday March 14 2015 11:56:08 EDT Ventricular Rate:  80 PR Interval:  170 QRS Duration: 80 QT Interval:  384 QTC Calculation: 442 R Axis:   15 Text Interpretation:  Normal sinus rhythm Low voltage QRS Borderline ECG  No significant change was found Confirmed by Wyvonnia Dusky  MD, Danaria Larsen [phone])  on 03/14/2015 2:42:10 PM      MDM   Final diagnoses:  RUQ pain  Hiatal hernia  Gallstones   Upper abdominal pain rating to the back associated with nausea similar to previous episodes. EKG is nonischemic. Low suspicion for cardiac etiology.  Abdominal series is negative. LFTs and lipase are normal. Ultrasound shows gallstones without cholecystitis.  CT shows cholelithiasis without cholecystitis. Patient has a large hiatal hernia which may be contributing to her symptoms. She takes Prilosec once daily. We'll switch to Protonix. She is at risk for ulcers given her chronic NSAID use.  She needs to follow-up with both GI and surgery for evaluation of her hiatal hernia and gallbladder stones. Return precautions discussed.   Ezequiel Essex, MD 03/14/15 [phone]

## 2015-03-14 NOTE — ED Notes (Signed)
CT notified contrast is complete

## 2015-03-14 NOTE — ED Notes (Signed)
Contacted lab about troponin results.  Lab stated they would re-run add on troponin test for some reason it never resulted.

## 2015-03-14 NOTE — ED Notes (Signed)
Pt ambulated to bathroom 

## 2015-03-15 ENCOUNTER — Encounter: Payer: Self-pay | Admitting: Physician Assistant

## 2015-03-21 DIAGNOSIS — L4050 Arthropathic psoriasis, unspecified: Secondary | ICD-10-CM | POA: Diagnosis not present

## 2015-03-23 ENCOUNTER — Encounter: Payer: Self-pay | Admitting: Physician Assistant

## 2015-03-23 ENCOUNTER — Ambulatory Visit (INDEPENDENT_AMBULATORY_CARE_PROVIDER_SITE_OTHER): Payer: Medicare Other | Admitting: Physician Assistant

## 2015-03-23 VITALS — BP 118/80 | HR 90 | Ht 63.0 in | Wt 192.0 lb

## 2015-03-23 DIAGNOSIS — K8020 Calculus of gallbladder without cholecystitis without obstruction: Secondary | ICD-10-CM

## 2015-03-23 DIAGNOSIS — Z23 Encounter for immunization: Secondary | ICD-10-CM | POA: Diagnosis not present

## 2015-03-23 DIAGNOSIS — R1013 Epigastric pain: Secondary | ICD-10-CM | POA: Diagnosis not present

## 2015-03-23 DIAGNOSIS — K449 Diaphragmatic hernia without obstruction or gangrene: Secondary | ICD-10-CM | POA: Diagnosis not present

## 2015-03-23 NOTE — Patient Instructions (Signed)
Take Protonix 20 mg every morning.  Call in December for a January appointment with Dr. Fuller Plan.

## 2015-03-23 NOTE — Progress Notes (Signed)
Patient ID: Phyllis Austin, female   DOB: 1945/08/21, 69 y.o.   MRN: [id number]   Subjective:    Patient ID: Phyllis Austin, female    DOB: 1946/02/25, 69 y.o.   MRN: [id number]  HPI Phyllis Austin is a 69 year old white female former patient of Dr. Buel Ream who has not been seen here in several years. She would like to establish with Dr. Fuller Plan. She had undergone colonoscopy in May 2006 was found to have left-sided diverticulosis and otherwise negative exam. She comes in today after recent ER visit on 03/14/2015 when she presented with severe epigastric pain radiating to her back and associated with nausea. She says she has had similar but less severe episodes over the past several years but had not had one in the past 2-3 months. She says this episode was the worst she has ever had -was intense pain that was unbearable and lasted for over 12 hours by the time she was seen in the emergency room. Her pain has not recurred since that ER visit. Labs done 920 show WBC of 8.9 hemoglobin 13.9 hematocrit of 41,iver function studies were normal lipase 23, and UA was negative. Upper abdominal ultrasound 03/14/2015 showed multiple gallstones no gallbladder wall thickening common bile duct of 3.5 mm and a fatty liver. CT of the abdomen and pelvis also done 03/14/2015 showed a large hiatal hernia and a large gallstone measuring 2.9 cm which was felt to be calcified common bile duct normal and colonic diverticulosis. Patient has an appointment next week with [place] surgery/Dr. Rosendo Gros. She was advised by the emergency room to be seen by GI because they were also concerned about the possibility of ulcer disease given her chronic Mobic use. Patient has history of psoriatic and rheumatoid arthritis is on Mobic chronically and is also on Remicade.  Review of Systems Pertinent positive and negative review of systems were noted in the above HPI section.  All other review of systems was otherwise  negative.  Outpatient Encounter Prescriptions as of 03/23/2015  Medication Sig  . Doxylamine Succinate, Sleep, (SLEEP AID PO) Take 1 tablet by mouth at bedtime as needed (sleep).  . InFLIXimab (REMICADE IV) Inject into the vein. Patient gets every 8 weeks. Followed by Dr Stark Jock.  . meloxicam (MOBIC) 15 MG tablet Take 15 mg by mouth daily.  . pantoprazole (PROTONIX) 20 MG tablet Take 1 tablet (20 mg total) by mouth daily.  . sucralfate (CARAFATE) 1 G tablet Take 1 tablet (1 g total) by mouth 4 (four) times daily -  with meals and at bedtime.  . traMADol (ULTRAM) 50 MG tablet Take 50 mg by mouth every 6 (six) hours as needed.   No facility-administered encounter medications on file as of 03/23/2015.   No Known Allergies Patient Active Problem List   Diagnosis Date Noted  . Abdominal pain, epigastric 03/23/2015  . Cholelithiasis 03/23/2015  . Hiatal hernia 03/23/2015   Social History   Social History  . Marital Status: Married    Spouse Name: N/A  . Number of Children: 2  . Years of Education: N/A   Occupational History  . retired     Social History Main Topics  . Smoking status: Never Smoker   . Smokeless tobacco: Never Used  . Alcohol Use: No  . Drug Use: No  . Sexual Activity: Not on file   Other Topics Concern  . Not on file   Social History Narrative    Phyllis Austin's family history includes Diabetes in her  brother, maternal grandmother, and mother; Heart attack in her mother.      Objective:    Filed Vitals:   03/23/15 1033  BP: 118/80  Pulse: 90    Physical Exam  well-developed older white female in no acute distress, pleasant blood pressure 119/80 pulse 90 height 5 foot 3 weight 192 HEENT; nontraumatic normocephalic EOMI PERRLA sclera anicteric, Supple; no JVD, Cardiovascular; regular rate and rhythm with S1-S2 no murmur or gallop, Pulmonary; clear bilaterally, Abdomen; obese soft nontender no palpable mass or hepatosplenomegaly bowel sounds are present,  Rectal ;exam not done, Extremities ;no clubbing cyanosis or edema skin warm and dry she does have arthritic changes of her hands and feet, Neuropsych; mood and affect appropriate       Assessment & Plan:   # 1 69 yo female  With  Recent episode of severe epigastric pain radiating to her back associated with nausea- similar much less severe episodes over several years. Sxs very consistent with biliary colic. ER workup confirms cholelithiasis with no evidence for cholecystitis or  CBD stones #2 large hiatal hernia #3 diverticulosis #4 RA- on Remicade  #5 GERD #6 obesity  Plan; I do not feel EGD is indicated at present, she will  Continue Protonix.  Pt has appt with surgery next week and advised should proceed with lap cholecystectomy   low  Fat diet  She is due for follow up Colonoscopy , and is asked to make an appt in 2-3 months after she has recovered from gallbladder surgery and will plan colonoscopy and possible EGD at that time.  She will be established  With Dr. Fuller Plan at her  request   Amy Genia Harold PA-C 03/23/2015   Cc: No ref. Nilah Belcourt found

## 2015-03-24 NOTE — Progress Notes (Signed)
Reviewed and agree with management plan.  Malcolm T. Stark, MD FACG 

## 2015-03-29 DIAGNOSIS — K8020 Calculus of gallbladder without cholecystitis without obstruction: Secondary | ICD-10-CM | POA: Diagnosis not present

## 2015-04-05 ENCOUNTER — Ambulatory Visit: Payer: Self-pay | Admitting: General Surgery

## 2015-04-05 NOTE — H&P (Signed)
History of Present Illness [name] MD; 03/29/2015 10:45 AM) Patient words: evaluate gallbladder.  The patient is a 69 year old female who presents for evaluation of gall stones. The patient is a 69 year old female who is referred by Dr. Fuller Plan for evaluation of symptomatic cholelithiasis. The patient has had pain for several years on and off. Becoming more frequent and severe. She was recently ER secondary to the abdominal pain. This revealed a large almost 3 cm gallstone. Patient states that she's cut back on her diet and his limited the amount of food. LFTs were within normal limits.   Other Problems Ivor Costa, Jeff; 03/29/2015 10:13 AM) Arthritis Cholelithiasis Gastric Ulcer Gastroesophageal Reflux Disease  Past Surgical History Ivor Costa, CMA; 03/29/2015 10:13 AM) Cataract Surgery Bilateral. Cesarean Section - Multiple Foot Surgery Right. Knee Surgery Right.  Diagnostic Studies History Ivor Costa, [place]; 03/29/2015 10:13 AM) Colonoscopy 5-10 years ago Mammogram 1-3 years ago Pap Smear 1-5 years ago  Allergies Ivor Costa, [place]; 03/29/2015 10:14 AM) No Known Drug Allergies10/10/2014  Medication History Ivor Costa, CMA; 03/29/2015 10:14 AM) TraMADol HCl (50MG  Tablet, Oral) Active. Pantoprazole Sodium (20MG  Tablet DR, Oral) Active. Sucralfate (1GM Tablet, Oral) Active. Meloxicam (15MG  Tablet, Oral) Active. Medications Reconciled  Social History Ivor Costa, CMA; 03/29/2015 10:13 AM) Caffeine use Tea. No alcohol use No drug use Tobacco use Never smoker.  Family History Ivor Costa, [place]; 03/29/2015 10:13 AM) Alcohol Abuse Brother, Father. Arthritis Mother. Cerebrovascular Accident Father. Diabetes Mellitus Brother, Mother. Heart Disease Mother.  Pregnancy / Birth History Ivor Costa, [place]; 03/29/2015 10:13 AM) Age at menarche 88 years. Age of menopause <44 Contraceptive History Oral contraceptives. Gravida  2 Irregular periods Maternal age 33-35 Para 2  Review of Systems Ivor Costa CMA; 03/29/2015 10:13 AM) General Not Present- Appetite Loss, Chills, Fatigue, Fever, Night Sweats, Weight Gain and Weight Loss. Skin Not Present- Change in Wart/Mole, Dryness, Hives, Jaundice, New Lesions, Non-Healing Wounds, Rash and Ulcer. HEENT Present- Wears glasses/contact lenses. Not Present- Earache, Hearing Loss, Hoarseness, Nose Bleed, Oral Ulcers, Ringing in the Ears, Seasonal Allergies, Sinus Pain, Sore Throat, Visual Disturbances and Yellow Eyes. Respiratory Present- Snoring. Not Present- Bloody sputum, Chronic Cough, Difficulty Breathing and Wheezing. Breast Not Present- Breast Mass, Breast Pain, Nipple Discharge and Skin Changes. Cardiovascular Present- Leg Cramps and Swelling of Extremities. Not Present- Chest Pain, Difficulty Breathing Lying Down, Palpitations, Rapid Heart Rate and Shortness of Breath. Gastrointestinal Present- Abdominal Pain. Not Present- Bloating, Bloody Stool, Change in Bowel Habits, Chronic diarrhea, Constipation, Difficulty Swallowing, Excessive gas, Gets full quickly at meals, Hemorrhoids, Indigestion, Nausea, Rectal Pain and Vomiting. Female Genitourinary Present- Urgency. Not Present- Frequency, Nocturia, Painful Urination and Pelvic Pain. Musculoskeletal Present- Joint Pain. Not Present- Back Pain, Joint Stiffness, Muscle Pain, Muscle Weakness and Swelling of Extremities. Neurological Not Present- Decreased Memory, Fainting, Headaches, Numbness, Seizures, Tingling, Tremor, Trouble walking and Weakness. Psychiatric Not Present- Anxiety, Bipolar, Change in Sleep Pattern, Depression, Fearful and Frequent crying. Endocrine Not Present- Cold Intolerance, Excessive Hunger, Hair Changes, Heat Intolerance, Hot flashes and New Diabetes. Hematology Not Present- Easy Bruising, Excessive bleeding, Gland problems, HIV and Persistent Infections.   Vitals Ivor Costa CMA; 03/29/2015  10:14 AM) 03/29/2015 10:12 AM Weight: 193.2 lb Temp.: 81F(Temporal)  Pulse: 78 (Regular)  Resp.: 16 (Unlabored)  BP: 132/84 (Sitting, Left Arm, Standard)    Physical Exam [name], MD; 03/29/2015 10:44 00) General Mental Status-Alert. General Appearance-Consistent with stated age. Hydration-Well hydrated. Voice-Normal.  Head and Neck Head-normocephalic, atraumatic with no lesions or palpable masses.  Eye Eyeball - Bilateral-Extraocular movements intact. Sclera/Conjunctiva - Bilateral-No scleral icterus.  Chest and Lung Exam Chest and lung exam reveals -quiet, even and easy respiratory effort with no use of accessory muscles. Inspection Chest Wall - Normal. Back - normal.  Cardiovascular Cardiovascular examination reveals -normal heart sounds, regular rate and rhythm with no murmurs.  Abdomen Inspection Normal Exam - No Hernias. Palpation/Percussion Normal exam - Soft, Non Tender, No Rebound tenderness, No Rigidity (guarding) and No hepatosplenomegaly. Auscultation Normal exam - Bowel sounds normal.  Neurologic Neurologic evaluation reveals -alert and oriented x 3 with no impairment of recent or remote memory. Mental Status-Normal.  Musculoskeletal Normal Exam - Left-Upper Extremity Strength Normal and Lower Extremity Strength Normal. Normal Exam - Right-Upper Extremity Strength Normal, Lower Extremity Weakness.    Assessment & Plan [name] MD; 03/29/2015 10:46 AM) SYMPTOMATIC CHOLELITHIASIS (K80.20) Impression: 69 year old female symptomatic cholelithiasis  1. The patient will like to proceed to operative for laparoscopic cholecystectomy. 2. Risks and benefits were discussed with the patient to generally include, but not limited to: infection, bleeding, possible need for post op ERCP, damage to the bile ducts, bile leak, and possible need for further surgery. Alternatives were offered and described. All questions  were answered and the patient voiced understanding of the procedure and wishes to proceed at this point with a laparoscopic cholecystectomy

## 2015-04-11 ENCOUNTER — Other Ambulatory Visit (HOSPITAL_COMMUNITY): Payer: Self-pay | Admitting: *Deleted

## 2015-04-11 ENCOUNTER — Encounter (HOSPITAL_COMMUNITY): Payer: Self-pay

## 2015-04-11 ENCOUNTER — Encounter (HOSPITAL_COMMUNITY)
Admission: RE | Admit: 2015-04-11 | Discharge: 2015-04-11 | Disposition: A | Discharge status: Home / Self Care | Payer: Medicare Other | Source: Ambulatory Visit | Attending: General Surgery | Admitting: General Surgery

## 2015-04-11 DIAGNOSIS — K259 Gastric ulcer, unspecified as acute or chronic, without hemorrhage or perforation: Secondary | ICD-10-CM | POA: Diagnosis not present

## 2015-04-11 DIAGNOSIS — K759 Inflammatory liver disease, unspecified: Secondary | ICD-10-CM | POA: Diagnosis not present

## 2015-04-11 DIAGNOSIS — K8010 Calculus of gallbladder with chronic cholecystitis without obstruction: Secondary | ICD-10-CM | POA: Diagnosis not present

## 2015-04-11 DIAGNOSIS — K219 Gastro-esophageal reflux disease without esophagitis: Secondary | ICD-10-CM | POA: Diagnosis not present

## 2015-04-11 DIAGNOSIS — K8080 Other cholelithiasis without obstruction: Secondary | ICD-10-CM | POA: Diagnosis present

## 2015-04-11 DIAGNOSIS — M1990 Unspecified osteoarthritis, unspecified site: Secondary | ICD-10-CM | POA: Diagnosis not present

## 2015-04-11 LAB — CBC
HEMATOCRIT: 39.6 % (ref 36.0–46.0)
Hemoglobin: 12.6 g/dL (ref 12.0–15.0)
MCH: 30.2 pg (ref 26.0–34.0)
MCHC: 31.8 g/dL (ref 30.0–36.0)
MCV: 95 fL (ref 78.0–100.0)
PLATELETS: 190 10*3/uL (ref 150–400)
RBC: 4.17 MIL/uL (ref 3.87–5.11)
RDW: 12.7 % (ref 11.5–15.5)
WBC: 5.9 10*3/uL (ref 4.0–10.5)

## 2015-04-11 LAB — BASIC METABOLIC PANEL
Anion gap: 7 (ref 5–15)
BUN: 19 mg/dL (ref 6–20)
CO2: 24 mmol/L (ref 22–32)
CREATININE: 0.81 mg/dL (ref 0.44–1.00)
Calcium: 8.7 mg/dL — ABNORMAL LOW (ref 8.9–10.3)
Chloride: 108 mmol/L (ref 101–111)
GFR calc Af Amer: >60 mL/min (ref >60)
GLUCOSE: 125 mg/dL — AB (ref 65–99)
Potassium: 4.1 mmol/L (ref 3.5–5.1)
SODIUM: 139 mmol/L (ref 135–145)

## 2015-04-11 NOTE — Progress Notes (Signed)
Called Dr. Johney Frame office to request that consent form be rewritten without the abbreviations (which are not hospital approved). Left message on Kim's voicemail.

## 2015-04-11 NOTE — Progress Notes (Signed)
Pt denies any cardiac history, chest pain or sob. 

## 2015-04-11 NOTE — Pre-Procedure Instructions (Signed)
Phyllis Austin  04/11/2015     Your procedure is scheduled on Wednesday, April 12, 2015 at 12:45 PM.   Report to [facility] Entrance "A" Admitting Office at 10:45 AM.   Call this number if you have problems the morning of surgery: [phone]     Remember:  Do not eat food or drink liquids after midnight tonight.  Take these medicines the morning of surgery with A SIP OF WATER: Pantoprazole (Protonix), Tramadol - if needed  Do not take Meloxicam tonight   Do not wear jewelry, make-up or nail polish.  Do not wear lotions, powders, or perfumes.  You may wear deodorant.  Do not shave 48 hours prior to surgery.    Do not bring valuables to the hospital.  [facility] is not responsible for any belongings or valuables.  Contacts, dentures or bridgework may not be worn into surgery.  Leave your suitcase in the car.  After surgery it may be brought to your room.  For patients admitted to the hospital, discharge time will be determined by your treatment team.  Patients discharged the day of surgery will not be allowed to drive home.   Special instructions:  [facility] - Preparing for Surgery  Before surgery, you can play an important role.  Because skin is not sterile, your skin needs to be as free of germs as possible.  You can reduce the number of germs on you skin by washing with CHG (chlorahexidine gluconate) soap before surgery.  CHG is an antiseptic cleaner which kills germs and bonds with the skin to continue killing germs even after washing.  Please DO NOT use if you have an allergy to CHG or antibacterial soaps.  If your skin becomes reddened/irritated stop using the CHG and inform your nurse when you arrive at Short Stay.  Do not shave (including legs and underarms) for at least 48 hours prior to the first CHG shower.  You may shave your face.  Please follow these instructions carefully:   1.  Shower with CHG Soap the night before surgery and the                                 morning of Surgery.  2.  If you choose to wash your hair, wash your hair first as usual with your       normal shampoo.  3.  After you shampoo, rinse your hair and body thoroughly to remove the                      Shampoo.  4.  Use CHG as you would any other liquid soap.  You can apply chg directly       to the skin and wash gently with scrungie or a clean washcloth.  5.  Apply the CHG Soap to your body ONLY FROM THE NECK DOWN.        Do not use on open wounds or open sores.  Avoid contact with your eyes, ears, mouth and genitals (private parts).  Wash genitals (private parts) with your normal soap.  6.  Wash thoroughly, paying special attention to the area where your surgery        will be performed.  7.  Thoroughly rinse your body with warm water from the neck down.  8.  DO NOT shower/wash with your normal soap after using and rinsing off  the CHG Soap.  9.  Pat yourself dry with a clean towel.            10.  Wear clean pajamas.            11.  Place clean sheets on your bed the night of your first shower and do not        sleep with pets.  Day of Surgery  Do not apply any lotions the morning of surgery.  Please wear clean clothes to the hospital.   Please read over the following fact sheets that you were given. Pain Booklet, Coughing and Deep Breathing and Surgical Site Infection Prevention

## 2015-04-12 ENCOUNTER — Ambulatory Visit (HOSPITAL_COMMUNITY): Payer: Medicare Other | Admitting: Certified Registered Nurse Anesthetist

## 2015-04-12 ENCOUNTER — Encounter (HOSPITAL_COMMUNITY): Payer: Self-pay | Admitting: *Deleted

## 2015-04-12 ENCOUNTER — Ambulatory Visit (HOSPITAL_COMMUNITY)
Admission: RE | Admit: 2015-04-12 | Discharge: 2015-04-12 | Disposition: A | Discharge status: Home / Self Care | Payer: Medicare Other | Source: Ambulatory Visit | Attending: General Surgery | Admitting: General Surgery

## 2015-04-12 ENCOUNTER — Encounter (HOSPITAL_COMMUNITY)
Admission: RE | Disposition: A | Discharge status: Home / Self Care | Payer: Self-pay | Source: Ambulatory Visit | Attending: General Surgery

## 2015-04-12 DIAGNOSIS — K759 Inflammatory liver disease, unspecified: Secondary | ICD-10-CM | POA: Insufficient documentation

## 2015-04-12 DIAGNOSIS — K8010 Calculus of gallbladder with chronic cholecystitis without obstruction: Secondary | ICD-10-CM | POA: Insufficient documentation

## 2015-04-12 DIAGNOSIS — K259 Gastric ulcer, unspecified as acute or chronic, without hemorrhage or perforation: Secondary | ICD-10-CM | POA: Diagnosis not present

## 2015-04-12 DIAGNOSIS — M1990 Unspecified osteoarthritis, unspecified site: Secondary | ICD-10-CM | POA: Diagnosis not present

## 2015-04-12 DIAGNOSIS — K219 Gastro-esophageal reflux disease without esophagitis: Secondary | ICD-10-CM | POA: Diagnosis not present

## 2015-04-12 DIAGNOSIS — K8020 Calculus of gallbladder without cholecystitis without obstruction: Secondary | ICD-10-CM | POA: Diagnosis not present

## 2015-04-12 DIAGNOSIS — M069 Rheumatoid arthritis, unspecified: Secondary | ICD-10-CM | POA: Diagnosis not present

## 2015-04-12 HISTORY — PX: CHOLECYSTECTOMY: SHX55

## 2015-04-12 SURGERY — LAPAROSCOPIC CHOLECYSTECTOMY
Anesthesia: General

## 2015-04-12 MED ORDER — ONDANSETRON HCL 4 MG/2ML IJ SOLN
INTRAMUSCULAR | Status: DC | PRN
  Administered 2015-04-12: 4 mg via INTRAVENOUS

## 2015-04-12 MED ORDER — PROMETHAZINE HCL 25 MG/ML IJ SOLN
6.2500 mg | INTRAMUSCULAR | Status: DC | PRN
Start: 2015-04-12 — End: 2015-04-12
  Administered 2015-04-12: 625 mg via INTRAVENOUS

## 2015-04-12 MED ORDER — SODIUM CHLORIDE 0.9 % IV SOLN
250.0000 mL | INTRAVENOUS | Status: DC | PRN
Start: 2015-04-12 — End: 2015-04-12

## 2015-04-12 MED ORDER — ACETAMINOPHEN 325 MG PO TABS: 650.0000 mg | ORAL_TABLET | ORAL | Status: DC | PRN

## 2015-04-12 MED ORDER — OXYCODONE HCL 5 MG PO TABS
5.0000 mg | ORAL_TABLET | ORAL | Status: DC | PRN
  Administered 2015-04-12 (×2): 5 mg via ORAL

## 2015-04-12 MED ORDER — ACETAMINOPHEN 650 MG RE SUPP: 650.0000 mg | RECTAL | Status: DC | PRN

## 2015-04-12 MED ORDER — FENTANYL CITRATE (PF) 250 MCG/5ML IJ SOLN
INTRAMUSCULAR | Status: AC
  Filled 2015-04-12: qty 5

## 2015-04-12 MED ORDER — OXYCODONE HCL 5 MG PO TABS
ORAL_TABLET | ORAL | Status: AC
  Filled 2015-04-12: qty 1

## 2015-04-12 MED ORDER — SODIUM CHLORIDE 0.9 % IR SOLN
Status: DC | PRN
  Administered 2015-04-12: 1000 mL

## 2015-04-12 MED ORDER — LACTATED RINGERS IV SOLN
INTRAVENOUS | Status: DC
  Administered 2015-04-12 (×2): via INTRAVENOUS

## 2015-04-12 MED ORDER — OXYCODONE-ACETAMINOPHEN 5-325 MG PO TABS: 1.0000 | ORAL_TABLET | ORAL | Status: DC | PRN

## 2015-04-12 MED ORDER — CEFAZOLIN SODIUM-DEXTROSE 2-3 GM-% IV SOLR
INTRAVENOUS | Status: AC
  Filled 2015-04-12: qty 50

## 2015-04-12 MED ORDER — HYDROMORPHONE HCL 1 MG/ML IJ SOLN
INTRAMUSCULAR | Status: AC
  Filled 2015-04-12: qty 1

## 2015-04-12 MED ORDER — SUCCINYLCHOLINE CHLORIDE 20 MG/ML IJ SOLN
INTRAMUSCULAR | Status: DC | PRN
  Administered 2015-04-12: 20 mg via INTRAVENOUS

## 2015-04-12 MED ORDER — LIDOCAINE HCL (CARDIAC) 20 MG/ML IV SOLN
INTRAVENOUS | Status: DC | PRN
  Administered 2015-04-12: 50 mg via INTRAVENOUS

## 2015-04-12 MED ORDER — CEFAZOLIN SODIUM-DEXTROSE 2-3 GM-% IV SOLR
2.0000 g | INTRAVENOUS | Status: AC
  Administered 2015-04-12: 2 g via INTRAVENOUS

## 2015-04-12 MED ORDER — MORPHINE SULFATE (PF) 2 MG/ML IV SOLN
2.0000 mg | INTRAVENOUS | Status: DC | PRN
  Administered 2015-04-12: 2 mg via INTRAVENOUS

## 2015-04-12 MED ORDER — SODIUM CHLORIDE 0.9 % IJ SOLN: 3.0000 mL | Freq: Two times a day (BID) | INTRAMUSCULAR | Status: DC

## 2015-04-12 MED ORDER — HYDROMORPHONE HCL 1 MG/ML IJ SOLN
0.2500 mg | INTRAMUSCULAR | Status: DC | PRN
  Administered 2015-04-12 (×3): 0.25 mg via INTRAVENOUS
  Administered 2015-04-12 (×2): 0.5 mg via INTRAVENOUS
  Administered 2015-04-12: 0.25 mg via INTRAVENOUS

## 2015-04-12 MED ORDER — CHLORHEXIDINE GLUCONATE 4 % EX LIQD: 1.0000 "application " | Freq: Once | CUTANEOUS | Status: DC

## 2015-04-12 MED ORDER — MEPERIDINE HCL 25 MG/ML IJ SOLN: 6.2500 mg | INTRAMUSCULAR | Status: DC | PRN

## 2015-04-12 MED ORDER — FENTANYL CITRATE (PF) 100 MCG/2ML IJ SOLN
INTRAMUSCULAR | Status: DC | PRN
  Administered 2015-04-12: 100 ug via INTRAVENOUS
  Administered 2015-04-12 (×2): 50 ug via INTRAVENOUS

## 2015-04-12 MED ORDER — PROMETHAZINE HCL 25 MG/ML IJ SOLN
INTRAMUSCULAR | Status: AC
  Filled 2015-04-12: qty 1

## 2015-04-12 MED ORDER — ROCURONIUM BROMIDE 100 MG/10ML IV SOLN
INTRAVENOUS | Status: DC | PRN
  Administered 2015-04-12: 40 mg via INTRAVENOUS

## 2015-04-12 MED ORDER — SODIUM CHLORIDE 0.9 % IJ SOLN: 3.0000 mL | INTRAMUSCULAR | Status: DC | PRN

## 2015-04-12 MED ORDER — 0.9 % SODIUM CHLORIDE (POUR BTL) OPTIME
TOPICAL | Status: DC | PRN
  Administered 2015-04-12: 1000 mL

## 2015-04-12 MED ORDER — DEXAMETHASONE SODIUM PHOSPHATE 4 MG/ML IJ SOLN
INTRAMUSCULAR | Status: DC | PRN
  Administered 2015-04-12: 4 mg via INTRAVENOUS

## 2015-04-12 MED ORDER — PROPOFOL 10 MG/ML IV BOLUS
INTRAVENOUS | Status: DC | PRN
  Administered 2015-04-12: 150 mg via INTRAVENOUS
  Administered 2015-04-12: 50 mg via INTRAVENOUS

## 2015-04-12 MED ORDER — SUGAMMADEX SODIUM 200 MG/2ML IV SOLN
INTRAVENOUS | Status: DC | PRN
  Administered 2015-04-12: 200 mg via INTRAVENOUS

## 2015-04-12 MED ORDER — PHENYLEPHRINE HCL 10 MG/ML IJ SOLN
INTRAMUSCULAR | Status: DC | PRN
  Administered 2015-04-12: 40 ug via INTRAVENOUS

## 2015-04-12 MED ORDER — PROPOFOL 10 MG/ML IV BOLUS
INTRAVENOUS | Status: AC
  Filled 2015-04-12: qty 20

## 2015-04-12 MED ORDER — BUPIVACAINE HCL 0.25 % IJ SOLN
INTRAMUSCULAR | Status: DC | PRN
  Administered 2015-04-12: 7 mL

## 2015-04-12 MED ORDER — SUGAMMADEX SODIUM 200 MG/2ML IV SOLN
INTRAVENOUS | Status: AC
  Filled 2015-04-12: qty 2

## 2015-04-12 MED ORDER — MORPHINE SULFATE (PF) 2 MG/ML IV SOLN
INTRAVENOUS | Status: AC
  Filled 2015-04-12: qty 1

## 2015-04-12 SURGICAL SUPPLY — 46 items
APL SKNCLS STERI-STRIP NONHPOA (GAUZE/BANDAGES/DRESSINGS) ×1
BAG SPEC RTRVL 10 TROC 200 (ENDOMECHANICALS) ×1
BENZOIN TINCTURE PRP APPL 2/3 (GAUZE/BANDAGES/DRESSINGS) ×3 IMPLANT
CANISTER SUCTION 2500CC (MISCELLANEOUS) ×3 IMPLANT
CHLORAPREP W/TINT 26ML (MISCELLANEOUS) ×3 IMPLANT
CLIP LIGATING HEMO O LOK GREEN (MISCELLANEOUS) ×6 IMPLANT
CLOSURE STERI-STRIP 1/2X4 (GAUZE/BANDAGES/DRESSINGS) ×1
CLSR STERI-STRIP ANTIMIC 1/2X4 (GAUZE/BANDAGES/DRESSINGS) ×2 IMPLANT
COVER SURGICAL LIGHT HANDLE (MISCELLANEOUS) ×3 IMPLANT
COVER TRANSDUCER ULTRASND (DRAPES) ×3 IMPLANT
DEVICE TROCAR PUNCTURE CLOSURE (ENDOMECHANICALS) ×3 IMPLANT
DISSECTOR BLUNT TIP ENDO 5MM (MISCELLANEOUS) ×3 IMPLANT
ELECT REM PT RETURN 9FT ADLT (ELECTROSURGICAL) ×3
ELECTRODE REM PT RTRN 9FT ADLT (ELECTROSURGICAL) ×1 IMPLANT
ENDOLOOP SUT PDS II  0 18 (SUTURE) ×4
ENDOLOOP SUT PDS II 0 18 (SUTURE) ×2 IMPLANT
GAUZE SPONGE 2X2 8PLY STRL LF (GAUZE/BANDAGES/DRESSINGS) ×1 IMPLANT
GLOVE BIO SURGEON STRL SZ7.5 (GLOVE) ×3 IMPLANT
GLOVE BIOGEL PI IND STRL 7.0 (GLOVE) ×1 IMPLANT
GLOVE BIOGEL PI INDICATOR 7.0 (GLOVE) ×2
GLOVE SURG SS PI 7.0 STRL IVOR (GLOVE) ×3 IMPLANT
GOWN STRL REUS W/ TWL LRG LVL3 (GOWN DISPOSABLE) ×2 IMPLANT
GOWN STRL REUS W/ TWL XL LVL3 (GOWN DISPOSABLE) ×1 IMPLANT
GOWN STRL REUS W/TWL LRG LVL3 (GOWN DISPOSABLE) ×6
GOWN STRL REUS W/TWL XL LVL3 (GOWN DISPOSABLE) ×3
KIT BASIN OR (CUSTOM PROCEDURE TRAY) ×3 IMPLANT
KIT ROOM TURNOVER OR (KITS) ×3 IMPLANT
NEEDLE INSUFFLATION 14GA 120MM (NEEDLE) ×3 IMPLANT
NS IRRIG 1000ML POUR BTL (IV SOLUTION) ×3 IMPLANT
PAD ARMBOARD 7.5X6 YLW CONV (MISCELLANEOUS) ×6 IMPLANT
POUCH RETRIEVAL ECOSAC 10 (ENDOMECHANICALS) ×1 IMPLANT
POUCH RETRIEVAL ECOSAC 10MM (ENDOMECHANICALS) ×2
SCISSORS LAP 5X35 DISP (ENDOMECHANICALS) ×3 IMPLANT
SET IRRIG TUBING LAPAROSCOPIC (IRRIGATION / IRRIGATOR) ×3 IMPLANT
SLEEVE ENDOPATH XCEL 5M (ENDOMECHANICALS) ×6 IMPLANT
SPECIMEN JAR MEDIUM (MISCELLANEOUS) ×3 IMPLANT
SPECIMEN JAR SMALL (MISCELLANEOUS) IMPLANT
SPONGE GAUZE 2X2 STER 10/PKG (GAUZE/BANDAGES/DRESSINGS) ×2
SUT MNCRL AB 3-0 PS2 18 (SUTURE) ×6 IMPLANT
SUT VICRYL 0 UR6 27IN ABS (SUTURE) ×6 IMPLANT
TOWEL OR 17X24 6PK STRL BLUE (TOWEL DISPOSABLE) ×3 IMPLANT
TOWEL OR 17X26 10 PK STRL BLUE (TOWEL DISPOSABLE) ×3 IMPLANT
TRAY LAPAROSCOPIC MC (CUSTOM PROCEDURE TRAY) ×3 IMPLANT
TROCAR XCEL NON-BLD 11X100MML (ENDOMECHANICALS) ×3 IMPLANT
TROCAR XCEL NON-BLD 5MMX100MML (ENDOMECHANICALS) ×3 IMPLANT
TUBING INSUFFLATION (TUBING) ×3 IMPLANT

## 2015-04-12 NOTE — Discharge Instructions (Signed)
[facility] ______[facility] °LAPAROSCOPIC SURGERY: POST OP INSTRUCTIONS °Always review your discharge instruction sheet given to you by the facility where your surgery was performed. °IF YOU HAVE DISABILITY OR FAMILY LEAVE FORMS, YOU MUST BRING THEM TO THE OFFICE FOR PROCESSING.   °DO NOT GIVE THEM TO YOUR DOCTOR. ° °1. A prescription for pain medication may be given to you upon discharge.  Take your pain medication as prescribed, if needed.  If narcotic pain medicine is not needed, then you may take acetaminophen (Tylenol) or ibuprofen (Advil) as needed. °2. Take your usually prescribed medications unless otherwise directed. °3. If you need a refill on your pain medication, please contact your pharmacy.  They will contact our office to request authorization. Prescriptions will not be filled after 5pm or on week-ends. °4. You should follow a light diet the first few days after arrival home, such as soup and crackers, etc.  Be sure to include lots of fluids daily. °5. Most patients will experience some swelling and bruising in the area of the incisions.  Ice packs will help.  Swelling and bruising can take several days to resolve.  °6. It is common to experience some constipation if taking pain medication after surgery.  Increasing fluid intake and taking a stool softener (such as Colace) will usually help or prevent this problem from occurring.  A mild laxative (Milk of Magnesia or Miralax) should be taken according to package instructions if there are no bowel movements after 48 hours. °7. Unless discharge instructions indicate otherwise, you may remove your bandages 24-48 hours after surgery, and you may shower at that time.  You may have steri-strips (small skin tapes) in place directly over the incision.  These strips should be left on the skin for 7-10 days.  If your surgeon used skin glue on the incision, you may shower in 24 hours.  The glue will flake off over the next 2-3 weeks.  Any sutures or  staples will be removed at the office during your follow-up visit. °8. ACTIVITIES:  You may resume regular (light) daily activities beginning the next day--such as daily self-care, walking, climbing stairs--gradually increasing activities as tolerated.  You may have sexual intercourse when it is comfortable.  Refrain from any heavy lifting or straining until approved by your doctor. °a. You may drive when you are no longer taking prescription pain medication, you can comfortably wear a seatbelt, and you can safely maneuver your car and apply brakes. °b. RETURN TO WORK:  __________________________________________________________ °9. You should see your doctor in the office for a follow-up appointment approximately 2-3 weeks after your surgery.  Make sure that you call for this appointment within a day or two after you arrive home to insure a convenient appointment time. °10. OTHER INSTRUCTIONS: __________________________________________________________________________________________________________________________ __________________________________________________________________________________________________________________________ °WHEN TO CALL YOUR DOCTOR: °1. Fever over 101.0 °2. Inability to urinate °3. Continued bleeding from incision. °4. Increased pain, redness, or drainage from the incision. °5. Increasing abdominal pain ° °The clinic staff is available to answer your questions during regular business hours.  Please don’t hesitate to call and ask to speak to one of the nurses for clinical concerns.  If you have a medical emergency, go to the nearest emergency room or call 911.  A surgeon from [facility] is always on call at the hospital. °[address], [address], [place], [place]  [address] ? [address] [address], [place], [place]   [address] °[phone] ? [phone] ? FAX [phone] °Web site:   [email or web address] °

## 2015-04-12 NOTE — Progress Notes (Signed)
Patient stated not ready to be discharged. Reports discomfort too high and unable to void. Son needs to leave to take care of family responsibility and can return around 11. Patient to remain in phase 2 until son can return

## 2015-04-12 NOTE — H&P (View-Only) (Signed)
History of Present Illness Phyllis Ok MD; 03/29/2015 10:45 AM) Patient words: evaluate gallbladder.  The patient is a 69 year old female who presents for evaluation of gall stones. The patient is a 69 year old female who is referred by Dr. Fuller Plan for evaluation of symptomatic cholelithiasis. The patient has had pain for several years on and off. Becoming more frequent and severe. She was recently ER secondary to the abdominal pain. This revealed a large almost 3 cm gallstone. Patient states that she's cut back on her diet and his limited the amount of food. LFTs were within normal limits.   Other Problems Ivor Costa, [place]; 03/29/2015 10:13 AM) Arthritis Cholelithiasis Gastric Ulcer Gastroesophageal Reflux Disease  Past Surgical History Ivor Costa, CMA; 03/29/2015 10:13 AM) Cataract Surgery Bilateral. Cesarean Section - Multiple Foot Surgery Right. Knee Surgery Right.  Diagnostic Studies History Ivor Costa, [place]; 03/29/2015 10:13 AM) Colonoscopy 5-10 years ago Mammogram 1-3 years ago Pap Smear 1-5 years ago  Allergies Ivor Costa, [place]; 03/29/2015 10:14 AM) No Known Drug Allergies10/10/2014  Medication History Ivor Costa, CMA; 03/29/2015 10:14 AM) TraMADol HCl (50MG  Tablet, Oral) Active. Pantoprazole Sodium (20MG  Tablet DR, Oral) Active. Sucralfate (1GM Tablet, Oral) Active. Meloxicam (15MG  Tablet, Oral) Active. Medications Reconciled  Social History Ivor Costa, CMA; 03/29/2015 10:13 AM) Caffeine use Tea. No alcohol use No drug use Tobacco use Never smoker.  Family History Ivor Costa, [place]; 03/29/2015 10:13 AM) Alcohol Abuse Brother, Father. Arthritis Mother. Cerebrovascular Accident Father. Diabetes Mellitus Brother, Mother. Heart Disease Mother.  Pregnancy / Birth History Ivor Costa, [place]; 03/29/2015 10:13 AM) Age at menarche 84 years. Age of menopause <44 Contraceptive History Oral contraceptives. Gravida  2 Irregular periods Maternal age 56-35 Para 2  Review of Systems Ivor Costa CMA; 03/29/2015 10:13 AM) General Not Present- Appetite Loss, Chills, Fatigue, Fever, Night Sweats, Weight Gain and Weight Loss. Skin Not Present- Change in Wart/Mole, Dryness, Hives, Jaundice, New Lesions, Non-Healing Wounds, Rash and Ulcer. HEENT Present- Wears glasses/contact lenses. Not Present- Earache, Hearing Loss, Hoarseness, Nose Bleed, Oral Ulcers, Ringing in the Ears, Seasonal Allergies, Sinus Pain, Sore Throat, Visual Disturbances and Yellow Eyes. Respiratory Present- Snoring. Not Present- Bloody sputum, Chronic Cough, Difficulty Breathing and Wheezing. Breast Not Present- Breast Mass, Breast Pain, Nipple Discharge and Skin Changes. Cardiovascular Present- Leg Cramps and Swelling of Extremities. Not Present- Chest Pain, Difficulty Breathing Lying Down, Palpitations, Rapid Heart Rate and Shortness of Breath. Gastrointestinal Present- Abdominal Pain. Not Present- Bloating, Bloody Stool, Change in Bowel Habits, Chronic diarrhea, Constipation, Difficulty Swallowing, Excessive gas, Gets full quickly at meals, Hemorrhoids, Indigestion, Nausea, Rectal Pain and Vomiting. Female Genitourinary Present- Urgency. Not Present- Frequency, Nocturia, Painful Urination and Pelvic Pain. Musculoskeletal Present- Joint Pain. Not Present- Back Pain, Joint Stiffness, Muscle Pain, Muscle Weakness and Swelling of Extremities. Neurological Not Present- Decreased Memory, Fainting, Headaches, Numbness, Seizures, Tingling, Tremor, Trouble walking and Weakness. Psychiatric Not Present- Anxiety, Bipolar, Change in Sleep Pattern, Depression, Fearful and Frequent crying. Endocrine Not Present- Cold Intolerance, Excessive Hunger, Hair Changes, Heat Intolerance, Hot flashes and New Diabetes. Hematology Not Present- Easy Bruising, Excessive bleeding, Gland problems, HIV and Persistent Infections.   Vitals Ivor Costa CMA; 03/29/2015  10:14 AM) 03/29/2015 10:12 AM Weight: 193.2 lb Temp.: 84F(Temporal)  Pulse: 78 (Regular)  Resp.: 16 (Unlabored)  BP: 132/84 (Sitting, Left Arm, Standard)    Physical Exam Phyllis Ok, MD; 03/29/2015 10:44 00) General Mental Status-Alert. General Appearance-Consistent with stated age. Hydration-Well hydrated. Voice-Normal.  Head and Neck Head-normocephalic, atraumatic with no lesions or palpable masses.  Eye Eyeball - Bilateral-Extraocular movements intact. Sclera/Conjunctiva - Bilateral-No scleral icterus.  Chest and Lung Exam Chest and lung exam reveals -quiet, even and easy respiratory effort with no use of accessory muscles. Inspection Chest Wall - Normal. Back - normal.  Cardiovascular Cardiovascular examination reveals -normal heart sounds, regular rate and rhythm with no murmurs.  Abdomen Inspection Normal Exam - No Hernias. Palpation/Percussion Normal exam - Soft, Non Tender, No Rebound tenderness, No Rigidity (guarding) and No hepatosplenomegaly. Auscultation Normal exam - Bowel sounds normal.  Neurologic Neurologic evaluation reveals -alert and oriented x 3 with no impairment of recent or remote memory. Mental Status-Normal.  Musculoskeletal Normal Exam - Left-Upper Extremity Strength Normal and Lower Extremity Strength Normal. Normal Exam - Right-Upper Extremity Strength Normal, Lower Extremity Weakness.    Assessment & Plan Phyllis Ok MD; 03/29/2015 10:46 AM) SYMPTOMATIC CHOLELITHIASIS (K80.20) Impression: 69 year old female symptomatic cholelithiasis  1. The patient will like to proceed to operative for laparoscopic cholecystectomy. 2. Risks and benefits were discussed with the patient to generally include, but not limited to: infection, bleeding, possible need for post op ERCP, damage to the bile ducts, bile leak, and possible need for further surgery. Alternatives were offered and described. All questions  were answered and the patient voiced understanding of the procedure and wishes to proceed at this point with a laparoscopic cholecystectomy

## 2015-04-12 NOTE — Op Note (Signed)
04/12/2015  2:24 PM  PATIENT:  Phyllis Austin  69 y.o. female  PRE-OPERATIVE DIAGNOSIS:  GALLSTONES  POST-OPERATIVE DIAGNOSIS:  GALLSTONES, CHRONIC CHOLECYSTITIS  PROCEDURE:  Procedure(s): LAPAROSCOPIC CHOLECYSTECTOMY (N/A)  SURGEON:  Surgeon(s) and Role:    * [name], MD - Primary  ANESTHESIA:   local and general  EBL:  Total I/O In: 1000 [I.V.:1000] Out: 75 [Blood:75]  BLOOD ADMINISTERED:none  DRAINS: none   LOCAL MEDICATIONS USED:  BUPIVICAINE    SPECIMEN:  Source of Specimen:  GALLBLADDER  DISPOSITION OF SPECIMEN:  PATHOLOGY  COUNTS:  YES  TOURNIQUET:  * No tourniquets in log *  DICTATION: .Dragon Dictation The patient was taken to the operating and placed in the supine position with bilateral SCDs in place. The patient was prepped and draped in the usual sterile fashion. A time out was called and all facts were verified. A pneumoperitoneum was obtained via A Veress needle technique to a pressure of 28mm of mercury.  A 97mm trochar was then placed in the right upper quadrant under visualization, and there were no injuries to any abdominal organs. A 11 mm port was then placed in the umbilical region after infiltrating with local anesthesia under direct visualization. A second and third epigastric port and right lower quadrant port placement under direct visualization, respectively. The liver was very fatty and large. The gallbladder was identified and retracted, the peritoneum was then sharply dissected from the gallbladder and this dissection was carried down to Calot's triangle. The cystic duct was identified and seen to be very short, and stripped away circumferentially and seen going into the gallbladder 360, the critical angle was obtained.  Secondary to the inflammation of the cystic duct and the shortness of the cystic duct, I proceed to remove gallbladder from the hepatic fossa in a dome down fashion.  The cystic duct was left attaching to the  gallbladder.  Secondary to the shortness of the gallbladder, 2 Endoloops were placed on the base of the gallbladder neck-cystic duct junction and the cystic duct transected.   The cystic artery was identified and 2 clips placed proximally and one distally and transected. We then proceeded to remove the gallbladder off the hepatic fossa with Bovie cautery. A retrieval bag was then placed in the abdomen and gallbladder placed in the bag. The hepatic fossa was then reexamined and hemostasis was achieved with Bovie cautery and was excellent at the end of the case. The subhepatic fossa and perihepatic fossa was then irrigated until the effluent was clear.  The bad and specimen was retrieved from the abdominal cavity.  The 11 mm trocar fascia was reapproximated with the Endo Close #1 Vicryl x5. The pneumoperitoneum was evacuated and all trochars removed under direct visulalization. The skin was then closed with 4-0 Monocryl and the skin dressed with Steri-Strips, gauze, and tape. The patient was awaken from general anesthesia and taken to the recovery room in stable condition.   PLAN OF CARE: Discharge to home after PACU  PATIENT DISPOSITION:  PACU - hemodynamically stable.   Delay start of Pharmacological VTE agent (>24hrs) due to surgical blood loss or risk of bleeding: not applicable

## 2015-04-12 NOTE — Transfer of Care (Signed)
Immediate Anesthesia Transfer of Care Note  Patient: Phyllis Austin  Procedure(s) Performed: Procedure(s): LAPAROSCOPIC CHOLECYSTECTOMY (N/A)  Patient Location: PACU  Anesthesia Type:General  Level of Consciousness: awake, alert , oriented, patient cooperative and responds to stimulation  Airway & Oxygen Therapy: Patient Spontanous Breathing and Patient connected to nasal cannula oxygen  Post-op Assessment: Report given to RN, Post -op Vital signs reviewed and stable and Patient moving all extremities X 4  Post vital signs: Reviewed and stable  Last Vitals:  Filed Vitals:   04/12/15 1430  BP: 175/72  Pulse: 84  Temp:   Resp: 15    Complications: No apparent anesthesia complications

## 2015-04-12 NOTE — Anesthesia Preprocedure Evaluation (Signed)
Anesthesia Evaluation  Patient identified by MRN, date of birth, ID band Patient awake    Reviewed: Allergy & Precautions, NPO status , Patient's Chart, lab work & pertinent test results  Airway Mallampati: II  TM Distance: >3 FB Neck ROM: Full    Dental no notable dental hx.    Pulmonary neg pulmonary ROS,    Pulmonary exam normal breath sounds clear to auscultation       Cardiovascular negative cardio ROS Normal cardiovascular exam Rhythm:Regular Rate:Normal     Neuro/Psych negative neurological ROS  negative psych ROS   GI/Hepatic negative GI ROS, hiatal hernia, (+) Hepatitis -  Endo/Other  negative endocrine ROS  Renal/GU negative Renal ROS     Musculoskeletal  (+) Arthritis ,   Abdominal   Peds  Hematology negative hematology ROS (+)   Anesthesia Other Findings   Reproductive/Obstetrics negative OB ROS                             Anesthesia Physical Anesthesia Plan  ASA: II  Anesthesia Plan: General   Post-op Pain Management:    Induction: Intravenous  Airway Management Planned: Oral ETT  Additional Equipment:   Intra-op Plan:   Post-operative Plan: Extubation in OR  Informed Consent: I have reviewed the patients History and Physical, chart, labs and discussed the procedure including the risks, benefits and alternatives for the proposed anesthesia with the patient or authorized representative who has indicated his/her understanding and acceptance.   Dental advisory given  Plan Discussed with: CRNA  Anesthesia Plan Comments:         Anesthesia Quick Evaluation

## 2015-04-12 NOTE — Anesthesia Procedure Notes (Signed)
Procedure Name: Intubation Date/Time: 04/12/2015 12:59 PM Performed by: Tressia Miners LEFFEW Pre-anesthesia Checklist: Patient identified, Patient being monitored, Timeout performed, Emergency Drugs available and Suction available Patient Re-evaluated:Patient Re-evaluated prior to inductionOxygen Delivery Method: Circle System Utilized Preoxygenation: Pre-oxygenation with 100% oxygen Intubation Type: IV induction Ventilation: Mask ventilation without difficulty Laryngoscope Size: Mac and 3 Grade View: Grade I Tube type: Oral Tube size: 7.0 mm Number of attempts: 1 Airway Equipment and Method: Stylet Placement Confirmation: ETT inserted through vocal cords under direct vision,  positive ETCO2 and breath sounds checked- equal and bilateral Secured at: 21 cm Tube secured with: Tape Dental Injury: Teeth and Oropharynx as per pre-operative assessment

## 2015-04-12 NOTE — Interval H&P Note (Signed)
History and Physical Interval Note:  04/12/2015 8:21 AM  Phyllis Austin  has presented today for surgery, with the diagnosis of GALLSTONES  The various methods of treatment have been discussed with the patient and family. After consideration of risks, benefits and other options for treatment, the patient has consented to  Procedure(s): LAPAROSCOPIC CHOLECYSTECTOMY (N/A) as a surgical intervention .  The patient's history has been reviewed, patient examined, no change in status, stable for surgery.  I have reviewed the patient's chart and labs.  Questions were answered to the patient's satisfaction.     Rosario Jacks., Anne Hahn

## 2015-04-13 ENCOUNTER — Encounter (HOSPITAL_COMMUNITY): Payer: Self-pay | Admitting: General Surgery

## 2015-04-13 NOTE — Anesthesia Postprocedure Evaluation (Signed)
Anesthesia Post Note  Patient: Phyllis Austin  Procedure(s) Performed: Procedure(s) (LRB): LAPAROSCOPIC CHOLECYSTECTOMY (N/A)  Anesthesia type: General  Patient location: PACU  Post pain: Pain level controlled  Post assessment: Post-op Vital signs reviewed  Last Vitals: BP 159/76 mmHg  Pulse 96  Temp(Src) 36.7 C (Oral)  Resp 16  Ht 5\' 3"  (1.6 m)  Wt 195 lb 5 oz (88.593 kg)  BMI 34.61 kg/m2  SpO2 95%  Post vital signs: Reviewed  Level of consciousness: sedated  Complications: No apparent anesthesia complications

## 2015-05-16 DIAGNOSIS — L4050 Arthropathic psoriasis, unspecified: Secondary | ICD-10-CM | POA: Diagnosis not present

## 2015-07-11 DIAGNOSIS — L4050 Arthropathic psoriasis, unspecified: Secondary | ICD-10-CM | POA: Diagnosis not present

## 2015-07-17 ENCOUNTER — Encounter: Payer: Self-pay | Admitting: Gastroenterology

## 2015-09-04 DIAGNOSIS — H40053 Ocular hypertension, bilateral: Secondary | ICD-10-CM | POA: Diagnosis not present

## 2015-09-05 DIAGNOSIS — L4050 Arthropathic psoriasis, unspecified: Secondary | ICD-10-CM | POA: Diagnosis not present

## 2015-10-26 DIAGNOSIS — M150 Primary generalized (osteo)arthritis: Secondary | ICD-10-CM | POA: Diagnosis not present

## 2015-10-26 DIAGNOSIS — L409 Psoriasis, unspecified: Secondary | ICD-10-CM | POA: Diagnosis not present

## 2015-10-26 DIAGNOSIS — K219 Gastro-esophageal reflux disease without esophagitis: Secondary | ICD-10-CM | POA: Diagnosis not present

## 2015-10-26 DIAGNOSIS — L4050 Arthropathic psoriasis, unspecified: Secondary | ICD-10-CM | POA: Diagnosis not present

## 2015-10-26 DIAGNOSIS — Z79899 Other long term (current) drug therapy: Secondary | ICD-10-CM | POA: Diagnosis not present

## 2015-10-31 DIAGNOSIS — L4050 Arthropathic psoriasis, unspecified: Secondary | ICD-10-CM | POA: Diagnosis not present

## 2015-12-27 DIAGNOSIS — L4050 Arthropathic psoriasis, unspecified: Secondary | ICD-10-CM | POA: Diagnosis not present

## 2016-02-20 DIAGNOSIS — Z79899 Other long term (current) drug therapy: Secondary | ICD-10-CM | POA: Diagnosis not present

## 2016-02-20 DIAGNOSIS — L4050 Arthropathic psoriasis, unspecified: Secondary | ICD-10-CM | POA: Diagnosis not present

## 2016-04-16 DIAGNOSIS — L4050 Arthropathic psoriasis, unspecified: Secondary | ICD-10-CM | POA: Diagnosis not present

## 2016-04-26 ENCOUNTER — Encounter: Payer: Self-pay | Admitting: Gastroenterology

## 2016-04-26 ENCOUNTER — Other Ambulatory Visit: Payer: Self-pay | Admitting: Obstetrics & Gynecology

## 2016-04-26 DIAGNOSIS — Z131 Encounter for screening for diabetes mellitus: Secondary | ICD-10-CM | POA: Diagnosis not present

## 2016-04-26 DIAGNOSIS — Z13220 Encounter for screening for lipoid disorders: Secondary | ICD-10-CM | POA: Diagnosis not present

## 2016-04-26 DIAGNOSIS — Z1389 Encounter for screening for other disorder: Secondary | ICD-10-CM | POA: Diagnosis not present

## 2016-04-26 DIAGNOSIS — Z124 Encounter for screening for malignant neoplasm of cervix: Secondary | ICD-10-CM | POA: Diagnosis not present

## 2016-04-26 DIAGNOSIS — Z1231 Encounter for screening mammogram for malignant neoplasm of breast: Secondary | ICD-10-CM | POA: Diagnosis not present

## 2016-04-29 LAB — CYTOLOGY - PAP

## 2016-05-03 ENCOUNTER — Ambulatory Visit: Payer: Medicare Other | Admitting: *Deleted

## 2016-05-03 VITALS — Ht 63.5 in | Wt 207.6 lb

## 2016-05-03 DIAGNOSIS — Z1211 Encounter for screening for malignant neoplasm of colon: Secondary | ICD-10-CM

## 2016-05-03 MED ORDER — SUPREP BOWEL PREP KIT 17.5-3.13-1.6 GM/177ML PO SOLN: 1.0000 | Freq: Once | ORAL | 0 refills | Status: AC

## 2016-05-03 NOTE — Progress Notes (Signed)
Patient denies any allergies to egg or soy products. Patient denies complications with anesthesia/sedation.  Patient denies oxygen use at home and denies diet medications. Emmi instructions for colonoscopy  explained and patient denied.

## 2016-05-07 DIAGNOSIS — Z23 Encounter for immunization: Secondary | ICD-10-CM | POA: Diagnosis not present

## 2016-05-07 DIAGNOSIS — B079 Viral wart, unspecified: Secondary | ICD-10-CM | POA: Diagnosis not present

## 2016-05-07 DIAGNOSIS — D485 Neoplasm of uncertain behavior of skin: Secondary | ICD-10-CM | POA: Diagnosis not present

## 2016-05-07 DIAGNOSIS — L570 Actinic keratosis: Secondary | ICD-10-CM | POA: Diagnosis not present

## 2016-05-13 ENCOUNTER — Encounter: Payer: Medicare Other | Admitting: Gastroenterology

## 2016-05-20 ENCOUNTER — Emergency Department (HOSPITAL_COMMUNITY): Payer: Medicare Other

## 2016-05-20 ENCOUNTER — Encounter (HOSPITAL_COMMUNITY): Payer: Self-pay | Admitting: *Deleted

## 2016-05-20 ENCOUNTER — Observation Stay (HOSPITAL_COMMUNITY)
Admission: EM | Admit: 2016-05-20 | Discharge: 2016-05-21 | Disposition: A | Discharge status: Home / Self Care | Payer: Medicare Other | Attending: Internal Medicine | Admitting: Internal Medicine

## 2016-05-20 DIAGNOSIS — M069 Rheumatoid arthritis, unspecified: Secondary | ICD-10-CM | POA: Diagnosis not present

## 2016-05-20 DIAGNOSIS — N201 Calculus of ureter: Secondary | ICD-10-CM | POA: Diagnosis present

## 2016-05-20 DIAGNOSIS — Z791 Long term (current) use of non-steroidal anti-inflammatories (NSAID): Secondary | ICD-10-CM | POA: Insufficient documentation

## 2016-05-20 DIAGNOSIS — N132 Hydronephrosis with renal and ureteral calculous obstruction: Secondary | ICD-10-CM | POA: Diagnosis not present

## 2016-05-20 DIAGNOSIS — R52 Pain, unspecified: Secondary | ICD-10-CM | POA: Diagnosis not present

## 2016-05-20 DIAGNOSIS — K219 Gastro-esophageal reflux disease without esophagitis: Secondary | ICD-10-CM | POA: Diagnosis not present

## 2016-05-20 DIAGNOSIS — R112 Nausea with vomiting, unspecified: Secondary | ICD-10-CM

## 2016-05-20 DIAGNOSIS — R911 Solitary pulmonary nodule: Secondary | ICD-10-CM | POA: Insufficient documentation

## 2016-05-20 DIAGNOSIS — L4050 Arthropathic psoriasis, unspecified: Secondary | ICD-10-CM | POA: Diagnosis not present

## 2016-05-20 DIAGNOSIS — Z79899 Other long term (current) drug therapy: Secondary | ICD-10-CM | POA: Insufficient documentation

## 2016-05-20 DIAGNOSIS — R109 Unspecified abdominal pain: Secondary | ICD-10-CM | POA: Diagnosis present

## 2016-05-20 DIAGNOSIS — K5730 Diverticulosis of large intestine without perforation or abscess without bleeding: Secondary | ICD-10-CM | POA: Diagnosis not present

## 2016-05-20 LAB — URINALYSIS, ROUTINE W REFLEX MICROSCOPIC
Bilirubin Urine: NEGATIVE
GLUCOSE, UA: NEGATIVE mg/dL
Ketones, ur: NEGATIVE mg/dL
LEUKOCYTES UA: NEGATIVE
Nitrite: NEGATIVE
PH: 6 (ref 5.0–8.0)
Protein, ur: NEGATIVE mg/dL
SPECIFIC GRAVITY, URINE: 1.025 (ref 1.005–1.030)

## 2016-05-20 LAB — COMPREHENSIVE METABOLIC PANEL
ALT: 16 U/L (ref 14–54)
AST: 26 U/L (ref 15–41)
Albumin: 4.3 g/dL (ref 3.5–5.0)
Alkaline Phosphatase: 54 U/L (ref 38–126)
Anion gap: 13 (ref 5–15)
BUN: 19 mg/dL (ref 6–20)
CHLORIDE: 104 mmol/L (ref 101–111)
CO2: 21 mmol/L — AB (ref 22–32)
Calcium: 9.3 mg/dL (ref 8.9–10.3)
Creatinine, Ser: 0.9 mg/dL (ref 0.44–1.00)
Glucose, Bld: 170 mg/dL — ABNORMAL HIGH (ref 65–99)
POTASSIUM: 3.8 mmol/L (ref 3.5–5.1)
SODIUM: 138 mmol/L (ref 135–145)
Total Bilirubin: 0.9 mg/dL (ref 0.3–1.2)
Total Protein: 7.6 g/dL (ref 6.5–8.1)

## 2016-05-20 LAB — CBC
HEMATOCRIT: 43.4 % (ref 36.0–46.0)
Hemoglobin: 14.8 g/dL (ref 12.0–15.0)
MCH: 31.5 pg (ref 26.0–34.0)
MCHC: 34.1 g/dL (ref 30.0–36.0)
MCV: 92.3 fL (ref 78.0–100.0)
Platelets: 220 10*3/uL (ref 150–400)
RBC: 4.7 MIL/uL (ref 3.87–5.11)
RDW: 12.5 % (ref 11.5–15.5)
WBC: 7.4 10*3/uL (ref 4.0–10.5)

## 2016-05-20 LAB — LIPASE, BLOOD: LIPASE: 34 U/L (ref 11–51)

## 2016-05-20 LAB — I-STAT CG4 LACTIC ACID, ED: Lactic Acid, Venous: 1.83 mmol/L (ref 0.5–1.9)

## 2016-05-20 LAB — PROTIME-INR
INR: 0.99
Prothrombin Time: 13.1 seconds (ref 11.4–15.2)

## 2016-05-20 LAB — URINE MICROSCOPIC-ADD ON

## 2016-05-20 MED ORDER — TAMSULOSIN HCL 0.4 MG PO CAPS
0.4000 mg | ORAL_CAPSULE | Freq: Every day | ORAL | Status: DC
  Administered 2016-05-20 – 2016-05-21 (×2): 0.4 mg via ORAL
  Filled 2016-05-20 (×2): qty 1

## 2016-05-20 MED ORDER — ONDANSETRON HCL 4 MG/2ML IJ SOLN: 4.0000 mg | Freq: Four times a day (QID) | INTRAMUSCULAR | Status: DC | PRN

## 2016-05-20 MED ORDER — ACETAMINOPHEN 325 MG PO TABS: 650.0000 mg | ORAL_TABLET | Freq: Four times a day (QID) | ORAL | Status: DC | PRN

## 2016-05-20 MED ORDER — HYDROMORPHONE HCL 2 MG/ML IJ SOLN
1.0000 mg | Freq: Once | INTRAMUSCULAR | Status: AC
  Administered 2016-05-20: 1 mg via INTRAVENOUS
  Filled 2016-05-20: qty 1

## 2016-05-20 MED ORDER — DEXTROSE 5 % IV SOLN
1.0000 g | INTRAVENOUS | Status: DC
  Administered 2016-05-20: 1 g via INTRAVENOUS
  Filled 2016-05-20 (×2): qty 10

## 2016-05-20 MED ORDER — IOPAMIDOL (ISOVUE-300) INJECTION 61%
INTRAVENOUS | Status: AC
  Administered 2016-05-20: 100 mL
  Filled 2016-05-20: qty 100

## 2016-05-20 MED ORDER — TRAMADOL HCL 50 MG PO TABS
50.0000 mg | ORAL_TABLET | Freq: Every day | ORAL | Status: DC
  Administered 2016-05-20: 50 mg via ORAL
  Filled 2016-05-20: qty 1

## 2016-05-20 MED ORDER — FENTANYL CITRATE (PF) 100 MCG/2ML IJ SOLN
50.0000 ug | INTRAMUSCULAR | Status: DC | PRN
  Administered 2016-05-20: 50 ug via NASAL

## 2016-05-20 MED ORDER — OXYCODONE-ACETAMINOPHEN 5-325 MG PO TABS
1.0000 | ORAL_TABLET | Freq: Once | ORAL | Status: AC
  Administered 2016-05-20: 1 via ORAL
  Filled 2016-05-20: qty 1

## 2016-05-20 MED ORDER — SODIUM CHLORIDE 0.9 % IV BOLUS (SEPSIS)
500.0000 mL | Freq: Once | INTRAVENOUS | Status: AC
  Administered 2016-05-20: 500 mL via INTRAVENOUS

## 2016-05-20 MED ORDER — FENTANYL CITRATE (PF) 100 MCG/2ML IJ SOLN
INTRAMUSCULAR | Status: AC
  Filled 2016-05-20: qty 2

## 2016-05-20 MED ORDER — PANTOPRAZOLE SODIUM 20 MG PO TBEC
20.0000 mg | DELAYED_RELEASE_TABLET | Freq: Every day | ORAL | Status: DC
  Administered 2016-05-20: 20 mg via ORAL
  Filled 2016-05-20: qty 1

## 2016-05-20 MED ORDER — ACETAMINOPHEN 650 MG RE SUPP: 650.0000 mg | Freq: Four times a day (QID) | RECTAL | Status: DC | PRN

## 2016-05-20 MED ORDER — KETOROLAC TROMETHAMINE 30 MG/ML IJ SOLN
30.0000 mg | Freq: Once | INTRAMUSCULAR | Status: AC
  Administered 2016-05-20: 30 mg via INTRAVENOUS
  Filled 2016-05-20: qty 1

## 2016-05-20 MED ORDER — SODIUM CHLORIDE 0.9 % IV SOLN
INTRAVENOUS | Status: DC
  Administered 2016-05-20 – 2016-05-21 (×2): via INTRAVENOUS

## 2016-05-20 MED ORDER — ONDANSETRON 4 MG PO TBDP
4.0000 mg | ORAL_TABLET | Freq: Once | ORAL | Status: AC
  Administered 2016-05-20: 4 mg via ORAL
  Filled 2016-05-20: qty 1

## 2016-05-20 MED ORDER — KETOROLAC TROMETHAMINE 15 MG/ML IJ SOLN: 15.0000 mg | Freq: Four times a day (QID) | INTRAMUSCULAR | Status: DC | PRN

## 2016-05-20 MED ORDER — ONDANSETRON HCL 4 MG/2ML IJ SOLN
4.0000 mg | Freq: Once | INTRAMUSCULAR | Status: AC
  Administered 2016-05-20: 4 mg via INTRAVENOUS
  Filled 2016-05-20: qty 2

## 2016-05-20 MED ORDER — ONDANSETRON HCL 4 MG PO TABS
4.0000 mg | ORAL_TABLET | Freq: Four times a day (QID) | ORAL | Status: DC | PRN
Start: 2016-05-20 — End: 2016-05-21

## 2016-05-20 MED ORDER — HYDROMORPHONE HCL 2 MG/ML IJ SOLN
1.0000 mg | Freq: Once | INTRAMUSCULAR | Status: DC
  Filled 2016-05-20: qty 1

## 2016-05-20 NOTE — ED Notes (Signed)
Patient transported to CT 

## 2016-05-20 NOTE — H&P (Signed)
History and Physical    Phyllis Austin [id number] DOB: [birth year] DOA: 05/20/2016  Referring MD/NP/PA: er PCP: Caffie Damme, MD Outpatient Specialists:  Patient coming from: home  Chief Complaint: pain  HPI: Phyllis Austin is a 70 y.o. female with medical history significant of GERD, diverticulosis, RA.  She developed acute RLQ and back Pain at 9 AM.  Patient vomiting, clammy and diaphoretic in ER.  + nausea and vomiting.  Subjective fever and chills today.  In the ER, a CT Scan showed a 71mm obstructing UPJ stone.  Also found was a lung nodule requiring follow up in 6 months.  Patient has been given IV Pain meds, fluids and flomax without relief.  Will be admitted to hospital for pain relief.    Review of Systems: all systems reviewed, negative unless stated above in HPI   Past Medical History:  Diagnosis Date  . Allergy   . Cataract    surgery to remove  . Diverticulosis   . Gallstones   . GERD (gastroesophageal reflux disease)   . Glaucoma    no med - just watching  . Hepatitis    as a child  . Hiatal hernia   . Osteoarthritis   . Psoriatic arthritis ([place])   . RA (rheumatoid arthritis) ([place])     Past Surgical History:  Procedure Laterality Date  . BUNIONECTOMY Right    with correction  . CESAREAN SECTION  10/21/79, 03/28/81   x 2  . CHOLECYSTECTOMY N/A 04/12/2015   Procedure: LAPAROSCOPIC CHOLECYSTECTOMY;  Surgeon: [name], MD;  Location: [facility];  Service: General;  Laterality: N/A;  . COLONOSCOPY  4/20006   Sharlett Iles  . DILATION AND CURETTAGE OF UTERUS    . EYE SURGERY Bilateral    cataract surgery w/ lens implant  . INTRAOCULAR LENS INSERTION Bilateral   . TIBIA FRACTURE SURGERY Right 1986   Bone transplant  . WISDOM TOOTH EXTRACTION       reports that she has never smoked. She has never used smokeless tobacco. She reports that she does not drink alcohol or use drugs.  No Known Allergies  Family History  Problem Relation Age of Onset   . Diabetes Mother   . Heart attack Mother   . Transient ischemic attack Mother   . Diabetes Brother   . Diabetes Maternal Grandmother   . Colon cancer Neg Hx   . Esophageal cancer Neg Hx   . Stomach cancer Neg Hx      Prior to Admission medications   Medication Sig Start Date End Date Taking? Authorizing Provider  acetaminophen (TYLENOL) 650 MG CR tablet Take 1,300 mg by mouth every 8 (eight) hours as needed for pain.   Yes Historical Provider, MD  Biotin 5 MG TABS Take 1 tablet by mouth at bedtime.   Yes Historical Provider, MD  Calcium Polycarbophil (FIBER) 625 MG TABS Take 1 tablet by mouth daily.   Yes Historical Provider, MD  Doxylamine Succinate, Sleep, (SLEEP AID PO) Take 1 tablet by mouth at bedtime.    Yes Historical Provider, MD  InFLIXimab (REMICADE IV) Inject into the vein See admin instructions. Every 8 weeks at Dr. [name] office - last infusion March 21, 2015   Yes Historical Provider, MD  meloxicam (MOBIC) 15 MG tablet Take 15 mg by mouth at bedtime.    Yes Historical Provider, MD  pantoprazole (PROTONIX) 20 MG tablet Take 1 tablet (20 mg total) by mouth daily. Patient taking differently: Take 20 mg by mouth  at bedtime.  03/14/15  Yes Ezequiel Essex, MD  Polyethyl Glycol-Propyl Glycol (SYSTANE OP) Place 1 drop into both eyes daily as needed (dry eyes).   Yes Historical Provider, MD  traMADol (ULTRAM) 50 MG tablet Take 50 mg by mouth at bedtime.    Yes Historical Provider, MD  calcium acetate (PHOSLO) 667 MG capsule Take 667 mg by mouth at bedtime.     Historical Provider, MD    Physical Exam: Vitals:   05/20/16 1548 05/20/16 1600 05/20/16 1615 05/20/16 1630  BP: 163/85 189/78 (!) 186/105 168/93  Pulse: 78  74   Resp: 16  (!) 28 15  Temp:      TempSrc:      SpO2: 96% (!) 78% 97%   Weight:      Height:          Constitutional: moving in bed, appears uncomfortable Vitals:   05/20/16 1548 05/20/16 1600 05/20/16 1615 05/20/16 1630  BP: 163/85 189/78 (!)  186/105 168/93  Pulse: 78  74   Resp: 16  (!) 28 15  Temp:      TempSrc:      SpO2: 96% (!) 78% 97%   Weight:      Height:       Eyes: PERRL, lids and conjunctivae normal ENMT: Mucous membranes are moist. Posterior pharynx clear of any exudate or lesions.Normal dentition.  Neck: normal, supple, no masses, no thyromegaly Respiratory: clear to auscultation bilaterally, no wheezing, no crackles. Normal respiratory effort. No accessory muscle use.  Cardiovascular: Regular rate and rhythm, no murmurs / rubs / gallops. No extremity edema. 2+ pedal pulses. No carotid bruits.  Abdomen:  Tenderness with palpation on right side, no masses palpated. No hepatosplenomegaly. Bowel sounds positive.  Musculoskeletal: no clubbing / cyanosis. No joint deformity upper and lower extremities. Good ROM, no contractures. Normal muscle tone.  Skin: no rashes, lesions, ulcers. No induration Neurologic: CN 2-12 grossly intact. Sensation intact, DTR normal. Strength 5/5 in all 4.  Psychiatric: Normal judgment and insight. Alert and oriented x 3. Normal mood.    Labs on Admission: I have personally reviewed following labs and imaging studies  CBC:  Recent Labs Lab 05/20/16 1016  WBC 7.4  HGB 14.8  HCT 43.4  MCV 92.3  PLT [id number]   Basic Metabolic Panel:  Recent Labs Lab 05/20/16 1016  NA 138  K 3.8  CL 104  CO2 21*  GLUCOSE 170*  BUN 19  CREATININE 0.90  CALCIUM 9.3   GFR: Estimated Creatinine Clearance: 63.1 mL/min (by C-G formula based on SCr of 0.9 mg/dL). Liver Function Tests:  Recent Labs Lab 05/20/16 1016  AST 26  ALT 16  ALKPHOS 54  BILITOT 0.9  PROT 7.6  ALBUMIN 4.3    Recent Labs Lab 05/20/16 1016  LIPASE 34   No results for input(s): AMMONIA in the last 168 hours. Coagulation Profile:  Recent Labs Lab 05/20/16 1130  INR 0.99   Cardiac Enzymes: No results for input(s): CKTOTAL, CKMB, CKMBINDEX, TROPONINI in the last 168 hours. BNP (last 3 results) No results  for input(s): PROBNP in the last 8760 hours. HbA1C: No results for input(s): HGBA1C in the last 72 hours. CBG: No results for input(s): GLUCAP in the last 168 hours. Lipid Profile: No results for input(s): CHOL, HDL, LDLCALC, TRIG, CHOLHDL, LDLDIRECT in the last 72 hours. Thyroid Function Tests: No results for input(s): TSH, T4TOTAL, FREET4, T3FREE, THYROIDAB in the last 72 hours. Anemia Panel: No results for input(s): VITAMINB12, FOLATE,  FERRITIN, TIBC, IRON, RETICCTPCT in the last 72 hours. Urine analysis:    Component Value Date/Time   COLORURINE YELLOW 05/20/2016 1302   APPEARANCEUR CLEAR 05/20/2016 1302   LABSPEC 1.025 05/20/2016 1302   PHURINE 6.0 05/20/2016 1302   GLUCOSEU NEGATIVE 05/20/2016 1302   HGBUR LARGE (A) 05/20/2016 1302   BILIRUBINUR NEGATIVE 05/20/2016 1302   KETONESUR NEGATIVE 05/20/2016 1302   PROTEINUR NEGATIVE 05/20/2016 1302   UROBILINOGEN 0.2 03/14/2015 1720   NITRITE NEGATIVE 05/20/2016 1302   LEUKOCYTESUR NEGATIVE 05/20/2016 1302   Sepsis Labs: Invalid input(s): PROCALCITONIN, LACTICIDVEN No results found for this or any previous visit (from the past 240 hour(s)).   Radiological Exams on Admission: Ct Abdomen Pelvis W Contrast  Result Date: 05/20/2016 CLINICAL DATA:  Right-sided abdominal pain.  Rule out appendicitis. EXAM: CT ABDOMEN AND PELVIS WITH CONTRAST TECHNIQUE: Multidetector CT imaging of the abdomen and pelvis was performed using the standard protocol following bolus administration of intravenous contrast. CONTRAST:  181mL ISOVUE-300 IOPAMIDOL (ISOVUE-300) INJECTION 61% COMPARISON:  03/14/2015 FINDINGS: Lower chest: Dependent atelectasis. Bronchiectasis at the medial right lung base. These findings are stable. There is a partially imaged 2.0 cm nodular density in the medial right lower lobe on image 1. Hepatobiliary: Postcholecystectomy. Unremarkable liver. Expected biliary dilatation. Pancreas: Unremarkable. Spleen: Unremarkable.  Adrenals/Urinary Tract: 3 mm calculus is present in the distal right ureter at the right ureterovesical junction. This is associated with right hydroureter and moderate right hydronephrosis. There is a slightly delayed nephrogram in the right kidney compare without of the left. There is stranding about the right renal pelvis. These are all secondary findings of ureteral obstruction. No solid mass. Normal adrenal glands. Stomach/Bowel: Large hiatal hernia contains portions of the stomach. Normal appendix. Diverticulosis of the descending and sigmoid colon. There is no evidence of acute diverticulitis. No evidence of small-bowel obstruction. Vascular/Lymphatic: Minimal aortic atherosclerotic calcifications. No aneurysm. No abnormal adenopathy. Reproductive: Uterus and adnexa are unremarkable. Other: No free-fluid Musculoskeletal: Bilateral L5 pars defects. Grade 2 L5-S1 spondylolisthesis. It is stable. IMPRESSION: 3 mm right ureteral vesicle junction calculus is associated with secondary findings of right ureteral obstruction Postcholecystectomy Sigmoid diverticulosis. Partially imaged 2.0 cm nodular density in the right lower lobe. Dedicated CT chest is recommended to further characterize. Electronically Signed   By: Marybelle Killings M.D.   On: 05/20/2016 13:00      Assessment/Plan Active Problems:   Right ureteral stone   Right 40mm ureteral stone -IVF -IV pain med -flomax -IV rocephin and urine culture Strain urine If not better in AM will get U/S RUQ and kidney U/A unremarkable  DVT prophylaxis:  Code Status: full Family Communication: patient Disposition Plan: home in AM if stone passed Consults called:  Admission status:    [place] DO [facility] Pager [phone]  If 7PM-7AM, please contact night-coverage [email or web address] Password [email or web address]  05/20/2016, 4:53 PM

## 2016-05-20 NOTE — Progress Notes (Signed)
Patient admitted from ED to room 6N01. Alert and oriented x4.Denies pain at this time. Oriented to room and call bell.

## 2016-05-20 NOTE — ED Notes (Signed)
ED Provider at bedside. 

## 2016-05-20 NOTE — ED Triage Notes (Signed)
Pt states acute onset RLQ pain at 9 am.  Son states pt never complainer.  Pt screaming in triage, vomiting bile, diaphoretic, clammy.

## 2016-05-20 NOTE — ED Provider Notes (Signed)
[facility] DEPT Provider Note   CSN: [id number] Arrival date & time: 05/20/16  0945     History   Chief Complaint Chief Complaint  Patient presents with  . Abdominal Pain    HPI Phyllis Austin is a 70 y.o. female.   Abdominal Pain   This is a new problem. The current episode started 1 to 2 hours ago. The problem occurs constantly. The problem has not changed since onset.The pain is associated with an unknown factor. The pain is located in the RUQ, RLQ and epigastric region. The quality of the pain is aching. The pain is at a severity of 9/10. The pain is severe. Associated symptoms include anorexia, nausea and vomiting. Pertinent negatives include fever, diarrhea, constipation, dysuria, hematuria and arthralgias. The symptoms are aggravated by palpation. Nothing relieves the symptoms.    Past Medical History:  Diagnosis Date  . Allergy   . Cataract    surgery to remove  . Diverticulosis   . Gallstones   . GERD (gastroesophageal reflux disease)   . Glaucoma    no med - just watching  . Hepatitis    as a child  . Hiatal hernia   . Osteoarthritis   . Psoriatic arthritis ([place])   . RA (rheumatoid arthritis) [facility])     Patient Active Problem List   Diagnosis Date Noted  . Abdominal pain, epigastric 03/23/2015  . Cholelithiasis 03/23/2015  . Hiatal hernia 03/23/2015    Past Surgical History:  Procedure Laterality Date  . BUNIONECTOMY Right    with correction  . CESAREAN SECTION  10/21/79, 03/28/81   x 2  . CHOLECYSTECTOMY N/A 04/12/2015   Procedure: LAPAROSCOPIC CHOLECYSTECTOMY;  Surgeon: [name], MD;  Location: [facility];  Service: General;  Laterality: N/A;  . COLONOSCOPY  4/20006   Sharlett Iles  . DILATION AND CURETTAGE OF UTERUS    . EYE SURGERY Bilateral    cataract surgery w/ lens implant  . INTRAOCULAR LENS INSERTION Bilateral   . TIBIA FRACTURE SURGERY Right 1986   Bone transplant  . WISDOM TOOTH EXTRACTION      OB History    No data available        Home Medications    Prior to Admission medications   Medication Sig Start Date End Date Taking? Authorizing Provider  acetaminophen (TYLENOL) 650 MG CR tablet Take 1,300 mg by mouth every 8 (eight) hours as needed for pain.   Yes Historical Provider, MD  Biotin 5 MG TABS Take 1 tablet by mouth at bedtime.   Yes Historical Provider, MD  Calcium Polycarbophil (FIBER) 625 MG TABS Take 1 tablet by mouth daily.   Yes Historical Provider, MD  Doxylamine Succinate, Sleep, (SLEEP AID PO) Take 1 tablet by mouth at bedtime.    Yes Historical Provider, MD  InFLIXimab (REMICADE IV) Inject into the vein See admin instructions. Every 8 weeks at Dr. [name] office - last infusion March 21, 2015   Yes Historical Provider, MD  meloxicam (MOBIC) 15 MG tablet Take 15 mg by mouth at bedtime.    Yes Historical Provider, MD  pantoprazole (PROTONIX) 20 MG tablet Take 1 tablet (20 mg total) by mouth daily. Patient taking differently: Take 20 mg by mouth at bedtime.  03/14/15  Yes Ezequiel Essex, MD  Polyethyl Glycol-Propyl Glycol (SYSTANE OP) Place 1 drop into both eyes daily as needed (dry eyes).   Yes Historical Provider, MD  traMADol (ULTRAM) 50 MG tablet Take 50 mg by mouth at bedtime.  Yes Historical Provider, MD  calcium acetate (PHOSLO) 667 MG capsule Take 667 mg by mouth at bedtime.     Historical Provider, MD    Family History Family History  Problem Relation Age of Onset  . Diabetes Mother   . Heart attack Mother   . Transient ischemic attack Mother   . Diabetes Brother   . Diabetes Maternal Grandmother   . Colon cancer Neg Hx   . Esophageal cancer Neg Hx   . Stomach cancer Neg Hx     Social History Social History  Substance Use Topics  . Smoking status: Never Smoker  . Smokeless tobacco: Never Used  . Alcohol use No     Allergies   Patient has no known allergies.   Review of Systems Review of Systems  Constitutional: Negative for chills and fever.  HENT: Negative  for ear pain and sore throat.   Eyes: Negative for pain and visual disturbance.  Respiratory: Negative for cough and shortness of breath.   Cardiovascular: Negative for chest pain and palpitations.  Gastrointestinal: Positive for abdominal pain, anorexia, nausea and vomiting. Negative for constipation and diarrhea.  Genitourinary: Negative for dysuria and hematuria.  Musculoskeletal: Negative for arthralgias and back pain.  Skin: Negative for color change and rash.  Neurological: Negative for seizures and syncope.  All other systems reviewed and are negative.    Physical Exam Updated Vital Signs BP (!) 186/105   Pulse 74   Temp 97.4 F (36.3 C) (Axillary)   Resp (!) 28   Ht 5' 3.5" (1.613 m)   Wt 93.9 kg   SpO2 97%   BMI 36.09 kg/m   Physical Exam  Constitutional: She appears well-developed and well-nourished. No distress.  HENT:  Head: Normocephalic and atraumatic.  Eyes: Conjunctivae are normal.  Neck: Neck supple.  Cardiovascular: Normal rate and regular rhythm.   No murmur heard. Pulmonary/Chest: Effort normal and breath sounds normal. No respiratory distress.  Abdominal: Soft. Bowel sounds are normal. There is tenderness in the right upper quadrant, right lower quadrant and epigastric area. There is no rigidity, no rebound and no guarding.  Musculoskeletal: She exhibits no edema.  Neurological: She is alert.  Skin: Skin is warm and dry.  Psychiatric: She has a normal mood and affect.  Nursing note and vitals reviewed.    ED Treatments / Results  Labs (all labs ordered are listed, but only abnormal results are displayed) Labs Reviewed  COMPREHENSIVE METABOLIC PANEL - Abnormal; Notable for the following:       Result Value   CO2 21 (*)    Glucose, Bld 170 (*)    All other components within normal limits  URINALYSIS, ROUTINE W REFLEX MICROSCOPIC (NOT AT [facility]) - Abnormal; Notable for the following:    Hgb urine dipstick LARGE (*)    All other components within  normal limits  URINE MICROSCOPIC-ADD ON - Abnormal; Notable for the following:    Squamous Epithelial / LPF 0-5 (*)    Bacteria, UA RARE (*)    All other components within normal limits  LIPASE, BLOOD  CBC  PROTIME-INR  I-STAT CG4 LACTIC ACID, ED  I-STAT CG4 LACTIC ACID, ED    EKG  EKG Interpretation None       Radiology Ct Abdomen Pelvis W Contrast  Result Date: 05/20/2016 CLINICAL DATA:  Right-sided abdominal pain.  Rule out appendicitis. EXAM: CT ABDOMEN AND PELVIS WITH CONTRAST TECHNIQUE: Multidetector CT imaging of the abdomen and pelvis was performed using the standard protocol  following bolus administration of intravenous contrast. CONTRAST:  143mL ISOVUE-300 IOPAMIDOL (ISOVUE-300) INJECTION 61% COMPARISON:  03/14/2015 FINDINGS: Lower chest: Dependent atelectasis. Bronchiectasis at the medial right lung base. These findings are stable. There is a partially imaged 2.0 cm nodular density in the medial right lower lobe on image 1. Hepatobiliary: Postcholecystectomy. Unremarkable liver. Expected biliary dilatation. Pancreas: Unremarkable. Spleen: Unremarkable. Adrenals/Urinary Tract: 3 mm calculus is present in the distal right ureter at the right ureterovesical junction. This is associated with right hydroureter and moderate right hydronephrosis. There is a slightly delayed nephrogram in the right kidney compare without of the left. There is stranding about the right renal pelvis. These are all secondary findings of ureteral obstruction. No solid mass. Normal adrenal glands. Stomach/Bowel: Large hiatal hernia contains portions of the stomach. Normal appendix. Diverticulosis of the descending and sigmoid colon. There is no evidence of acute diverticulitis. No evidence of small-bowel obstruction. Vascular/Lymphatic: Minimal aortic atherosclerotic calcifications. No aneurysm. No abnormal adenopathy. Reproductive: Uterus and adnexa are unremarkable. Other: No free-fluid Musculoskeletal:  Bilateral L5 pars defects. Grade 2 L5-S1 spondylolisthesis. It is stable. IMPRESSION: 3 mm right ureteral vesicle junction calculus is associated with secondary findings of right ureteral obstruction Postcholecystectomy Sigmoid diverticulosis. Partially imaged 2.0 cm nodular density in the right lower lobe. Dedicated CT chest is recommended to further characterize. Electronically Signed   By: Marybelle Killings M.D.   On: 05/20/2016 13:00    Procedures Procedures (including critical care time)  Medications Ordered in ED Medications  fentaNYL (SUBLIMAZE) 100 MCG/2ML injection (not administered)  ketorolac (TORADOL) 30 MG/ML injection 30 mg (not administered)  HYDROmorphone (DILAUDID) injection 1 mg (not administered)  tamsulosin (FLOMAX) capsule 0.4 mg (not administered)  sodium chloride 0.9 % bolus 500 mL (0 mLs Intravenous Stopped 05/20/16 1204)  HYDROmorphone (DILAUDID) injection 1 mg (1 mg Intravenous Given 05/20/16 1132)  iopamidol (ISOVUE-300) 61 % injection (100 mLs  Contrast Given 05/20/16 1212)  ondansetron (ZOFRAN) injection 4 mg (4 mg Intravenous Given 05/20/16 1241)  ondansetron (ZOFRAN-ODT) disintegrating tablet 4 mg (4 mg Oral Given 05/20/16 1545)  oxyCODONE-acetaminophen (PERCOCET/ROXICET) 5-325 MG per tablet 1 tablet (1 tablet Oral Given 05/20/16 1545)     Initial Impression / Assessment and Plan / ED Course  I have reviewed the triage vital signs and the nursing notes.  Pertinent labs & imaging results that were available during my care of the patient were reviewed by me and considered in my medical decision making (see chart for details).  Clinical Course     70yF with hx of cholecystectomy one year ago, comes today with sudden onset of right-sided abdominal pain. She has never experienced this pain before. She's had significant nausea and vomiting with it. Per heard her son she is not one to normally come to the hospital she is in severe pain. He lately normal bowel movements  normal urination.  Exam is significant for right-sided abdominal pain without rebound guarding or rigidity. However she is exquisitely tender to light and deep palpation. Normal bowel sounds no distention. Patient is afebrile vital signs are stable. She is given IV pain medication IV antiemetics IV fluids. CBC CMP lipase urinalysis are ordered. Patient will get a CT abdomen pelvis with contrast.  No leukocytosis, no electrolyte abnormalities normal renal and liver function. Lactic acid is 1.8.  CT abdomen pelvis shows a 3 mm obstructing right UPJ stone. This patient's presentation is likely the origin. No signs of appendicitis or other gastrointestinal pathology. There is a small nodule on her lungs  possibly causing some discomfort however unlikely to be origin. She will follow-up with [place] in 6 months for repeat. Patient will be admitted for intractable nausea vomiting and pain. IV medications and fluids are given. Vital signs stable time handoff.  Final Clinical Impressions(s) / ED Diagnoses   Final diagnoses:  Right ureteral stone  Intractable vomiting with nausea, unspecified vomiting type    New Prescriptions New Prescriptions   No medications on file     Dewaine Conger, MD 05/20/16 1628    Gwenyth Allegra Tegeler, MD 05/20/16 2207

## 2016-05-21 DIAGNOSIS — N132 Hydronephrosis with renal and ureteral calculous obstruction: Secondary | ICD-10-CM | POA: Diagnosis not present

## 2016-05-21 DIAGNOSIS — N201 Calculus of ureter: Secondary | ICD-10-CM | POA: Diagnosis not present

## 2016-05-21 LAB — BASIC METABOLIC PANEL
Anion gap: 10 (ref 5–15)
Anion gap: 8 (ref 5–15)
BUN: 16 mg/dL (ref 6–20)
BUN: 16 mg/dL (ref 6–20)
CALCIUM: 8.4 mg/dL — AB (ref 8.9–10.3)
CALCIUM: 8.1 mg/dL — AB (ref 8.9–10.3)
CHLORIDE: 106 mmol/L (ref 101–111)
CO2: 23 mmol/L (ref 22–32)
CO2: 26 mmol/L (ref 22–32)
CREATININE: 1.21 mg/dL — AB (ref 0.44–1.00)
CREATININE: 1.16 mg/dL — AB (ref 0.44–1.00)
Chloride: 107 mmol/L (ref 101–111)
GFR calc Af Amer: 54 mL/min — ABNORMAL LOW (ref >60)
GFR calc non Af Amer: 44 mL/min — ABNORMAL LOW (ref >60)
GFR, EST AFRICAN AMERICAN: 51 mL/min — AB (ref >60)
GFR, EST NON AFRICAN AMERICAN: 46 mL/min — AB (ref >60)
GLUCOSE: 97 mg/dL (ref 65–99)
Glucose, Bld: 110 mg/dL — ABNORMAL HIGH (ref 65–99)
Potassium: 3.6 mmol/L (ref 3.5–5.1)
Potassium: 3.5 mmol/L (ref 3.5–5.1)
SODIUM: 139 mmol/L (ref 135–145)
SODIUM: 141 mmol/L (ref 135–145)

## 2016-05-21 LAB — CBC
HCT: 37.5 % (ref 36.0–46.0)
Hemoglobin: 12.6 g/dL (ref 12.0–15.0)
MCH: 31.3 pg (ref 26.0–34.0)
MCHC: 33.6 g/dL (ref 30.0–36.0)
MCV: 93.1 fL (ref 78.0–100.0)
PLATELETS: 193 10*3/uL (ref 150–400)
RBC: 4.03 MIL/uL (ref 3.87–5.11)
RDW: 12.8 % (ref 11.5–15.5)
WBC: 6.8 10*3/uL (ref 4.0–10.5)

## 2016-05-21 NOTE — Progress Notes (Signed)
Patient's urine strained and there is a small what looks like a stone. Paged MD Eliseo Squires to let her know.

## 2016-05-21 NOTE — Progress Notes (Signed)
[name] to be D/C'd  per MD order. Discussed with the patient and all questions fully answered.  VSS, Skin clean, dry and intact without evidence of skin break down, no evidence of skin tears noted.  IV catheter discontinued intact. Site without signs and symptoms of complications. Dressing and pressure applied.  An After Visit Summary was printed and given to the patient. Patient received prescription.  D/c education completed with patient/family including follow up instructions, medication list, d/c activities limitations if indicated, with other d/c instructions as indicated by MD - patient able to verbalize understanding, all questions fully answered.   Patient instructed to return to ED, call 911, or call MD for any changes in condition.   Patient to be escorted via [place], and D/C home via private auto.

## 2016-05-21 NOTE — Care Management Note (Signed)
Case Management Note  Patient Details  Name: TKEYA LONIE MRN: [id number] Date of Birth: [birth year]  Subjective/Objective:                    Action/Plan:   Expected Discharge Date:                  Expected Discharge Plan:  Home/Self Care  In-House Referral:     Discharge planning Services     Post Acute Care Choice:    Choice offered to:     DME Arranged:    DME Agency:     HH Arranged:    [place] Agency:     Status of Service:  Completed, signed off  If discussed at [employer] of Stay Meetings, dates discussed:    Additional Comments:  Marilu Favre, RN 05/21/2016, 1:38 PM

## 2016-05-21 NOTE — Discharge Summary (Signed)
Physician Discharge Summary  Phyllis Austin [id number] DOB: Oct 24, 1945 DOA: 05/20/2016  PCP: Caffie Damme, MD  Admit date: 05/20/2016 Discharge date: 05/21/2016   Recommendations for Outpatient Follow-Up:   1. Urology follow up for stone analysis (sent)   Discharge Diagnosis:   Active Problems:   Right ureteral stone   Discharge disposition:  Home.  Discharge Condition: Improved.  Diet recommendation:   Regular.  Wound care: None.   History of Present Illness:   Phyllis Austin is a 70 y.o. female with medical history significant of GERD, diverticulosis, RA.  She developed acute RLQ and back Pain at 9 AM.  Patient vomiting, clammy and diaphoretic in ER.  + nausea and vomiting.  Subjective fever and chills today.  In the ER, a CT Scan showed a 43mm obstructing UPJ stone.  Also found was a lung nodule requiring follow up in 6 months.  Patient has been given IV Pain meds, fluids and flomax without relief.  Will be admitted to hospital for pain relief.   Hospital Course by Problem:   3 mm UPJ stone -passed -sent for analysis since was caught in [place] -patient already had urology follow up -no sign of infection    Medical Consultants:    None.   Discharge Exam:   Vitals:   05/20/16 1915 05/21/16 0500  BP: 169/82 118/61  Pulse: 79 82  Resp: 23 18  Temp:  98.8 F (37.1 C)   Vitals:   05/20/16 1900 05/20/16 1915 05/20/16 1945 05/21/16 0500  BP: 155/79 169/82  118/61  Pulse:  79  82  Resp: 16 23  18   Temp:    98.8 F (37.1 C)  TempSrc:      SpO2:  98%  95%  Weight:   94.7 kg (208 lb 12.4 oz)   Height:   5\' 3"  (1.6 m)     Gen:  NAD    The results of significant diagnostics from this hospitalization (including imaging, microbiology, ancillary and laboratory) are listed below for reference.     Procedures and Diagnostic Studies:   Ct Abdomen Pelvis W Contrast  Result Date: 05/20/2016 CLINICAL DATA:  Right-sided abdominal pain.   Rule out appendicitis. EXAM: CT ABDOMEN AND PELVIS WITH CONTRAST TECHNIQUE: Multidetector CT imaging of the abdomen and pelvis was performed using the standard protocol following bolus administration of intravenous contrast. CONTRAST:  154mL ISOVUE-300 IOPAMIDOL (ISOVUE-300) INJECTION 61% COMPARISON:  03/14/2015 FINDINGS: Lower chest: Dependent atelectasis. Bronchiectasis at the medial right lung base. These findings are stable. There is a partially imaged 2.0 cm nodular density in the medial right lower lobe on image 1. Hepatobiliary: Postcholecystectomy. Unremarkable liver. Expected biliary dilatation. Pancreas: Unremarkable. Spleen: Unremarkable. Adrenals/Urinary Tract: 3 mm calculus is present in the distal right ureter at the right ureterovesical junction. This is associated with right hydroureter and moderate right hydronephrosis. There is a slightly delayed nephrogram in the right kidney compare without of the left. There is stranding about the right renal pelvis. These are all secondary findings of ureteral obstruction. No solid mass. Normal adrenal glands. Stomach/Bowel: Large hiatal hernia contains portions of the stomach. Normal appendix. Diverticulosis of the descending and sigmoid colon. There is no evidence of acute diverticulitis. No evidence of small-bowel obstruction. Vascular/Lymphatic: Minimal aortic atherosclerotic calcifications. No aneurysm. No abnormal adenopathy. Reproductive: Uterus and adnexa are unremarkable. Other: No free-fluid Musculoskeletal: Bilateral L5 pars defects. Grade 2 L5-S1 spondylolisthesis. It is stable. IMPRESSION: 3 mm right ureteral vesicle junction calculus is associated with secondary  findings of right ureteral obstruction Postcholecystectomy Sigmoid diverticulosis. Partially imaged 2.0 cm nodular density in the right lower lobe. Dedicated CT chest is recommended to further characterize. Electronically Signed   By: Marybelle Killings M.D.   On: 05/20/2016 13:00     Labs:     Basic Metabolic Panel:  Recent Labs Lab 05/20/16 1016 05/21/16 0335  NA 138 139  K 3.8 3.6  CL 104 106  CO2 21* 23  GLUCOSE 170* 110*  BUN 19 16  CREATININE 0.90 1.21*  CALCIUM 9.3 8.4*   GFR Estimated Creatinine Clearance: 46.7 mL/min (by C-G formula based on SCr of 1.21 mg/dL (H)). Liver Function Tests:  Recent Labs Lab 05/20/16 1016  AST 26  ALT 16  ALKPHOS 54  BILITOT 0.9  PROT 7.6  ALBUMIN 4.3    Recent Labs Lab 05/20/16 1016  LIPASE 34   No results for input(s): AMMONIA in the last 168 hours. Coagulation profile  Recent Labs Lab 05/20/16 1130  INR 0.99    CBC:  Recent Labs Lab 05/20/16 1016 05/21/16 0335  WBC 7.4 6.8  HGB 14.8 12.6  HCT 43.4 37.5  MCV 92.3 93.1  PLT 220 193   Cardiac Enzymes: No results for input(s): CKTOTAL, CKMB, CKMBINDEX, TROPONINI in the last 168 hours. BNP: Invalid input(s): POCBNP CBG: No results for input(s): GLUCAP in the last 168 hours. D-Dimer No results for input(s): DDIMER in the last 72 hours. Hgb A1c No results for input(s): HGBA1C in the last 72 hours. Lipid Profile No results for input(s): CHOL, HDL, LDLCALC, TRIG, CHOLHDL, LDLDIRECT in the last 72 hours. Thyroid function studies No results for input(s): TSH, T4TOTAL, T3FREE, THYROIDAB in the last 72 hours.  Invalid input(s): FREET3 Anemia work up No results for input(s): VITAMINB12, FOLATE, FERRITIN, TIBC, IRON, RETICCTPCT in the last 72 hours. Microbiology No results found for this or any previous visit (from the past 240 hour(s)).   Discharge Instructions:   Discharge Instructions    Diet - low sodium heart healthy    Complete by:  As directed    Discharge instructions    Complete by:  As directed    Keep appointment with urology   Increase activity slowly    Complete by:  As directed        Medication List    TAKE these medications   acetaminophen 650 MG CR tablet Commonly known as:  TYLENOL Take 1,300 mg by mouth every 8  (eight) hours as needed for pain.   Biotin 5 MG Tabs Take 1 tablet by mouth at bedtime.   calcium acetate 667 MG capsule Commonly known as:  PHOSLO Take 667 mg by mouth at bedtime.   Fiber 625 MG Tabs Take 1 tablet by mouth daily.   meloxicam 15 MG tablet Commonly known as:  MOBIC Take 15 mg by mouth at bedtime.   pantoprazole 20 MG tablet Commonly known as:  PROTONIX Take 1 tablet (20 mg total) by mouth daily. What changed:  when to take this   REMICADE IV Inject into the vein See admin instructions. Every 8 weeks at Dr. [name] office - last infusion March 21, 2015   SLEEP AID PO Take 1 tablet by mouth at bedtime.   SYSTANE OP Place 1 drop into both eyes daily as needed (dry eyes).   traMADol 50 MG tablet Commonly known as:  ULTRAM Take 50 mg by mouth at bedtime.      Follow-up Information    PINN, Andrez Grime, MD  Follow up in 1 week(s).   Specialty:  Obstetrics and Gynecology Contact information: [address] [place] [address] [phone]            Time coordinating discharge: 35 min  Signed:  Geradine Girt   [facility] 05/21/2016, 11:47 AM

## 2016-05-21 NOTE — Care Management Obs Status (Signed)
[facility] NOTIFICATION   Patient Details  Name: KHAIA SEATON MRN: [id number] Date of Birth: 1945/10/31   Medicare Observation Status Notification Given:  Yes    Marilu Favre, RN 05/21/2016, 11:06 AM

## 2016-05-22 LAB — URINE CULTURE: Culture: <10000 — AB

## 2016-05-29 LAB — STONE ANALYSIS
CA OXALATE, MONOHYDR.: 95 %
CA PHOS CRY STONE QL IR: 5 %
Stone Weight KSTONE: 11 mg

## 2016-06-11 DIAGNOSIS — L4050 Arthropathic psoriasis, unspecified: Secondary | ICD-10-CM | POA: Diagnosis not present

## 2016-07-17 ENCOUNTER — Encounter (HOSPITAL_COMMUNITY): Payer: Self-pay | Admitting: *Deleted

## 2016-07-17 ENCOUNTER — Emergency Department (HOSPITAL_BASED_OUTPATIENT_CLINIC_OR_DEPARTMENT_OTHER)
Admit: 2016-07-17 | Discharge: 2016-07-17 | Disposition: A | Discharge status: Home / Self Care | Payer: Medicare Other | Attending: Emergency Medicine | Admitting: Emergency Medicine

## 2016-07-17 ENCOUNTER — Emergency Department (HOSPITAL_COMMUNITY)
Admission: EM | Admit: 2016-07-17 | Discharge: 2016-07-17 | Disposition: A | Discharge status: Home / Self Care | Payer: Medicare Other | Attending: Emergency Medicine | Admitting: Emergency Medicine

## 2016-07-17 DIAGNOSIS — M79609 Pain in unspecified limb: Secondary | ICD-10-CM | POA: Diagnosis not present

## 2016-07-17 DIAGNOSIS — Z79899 Other long term (current) drug therapy: Secondary | ICD-10-CM | POA: Diagnosis not present

## 2016-07-17 DIAGNOSIS — M79662 Pain in left lower leg: Secondary | ICD-10-CM | POA: Diagnosis not present

## 2016-07-17 NOTE — ED Provider Notes (Signed)
[facility] DEPT Provider Note   CSN: [id number] Arrival date & time: 07/17/16  1356     History   Chief Complaint Chief Complaint  Patient presents with  . Leg Pain    HPI Phyllis Austin is a 71 y.o. female.  HPI  71 y.o female with ho. RA presents today with several weeks of left lower leg swelling and pain.  Pain is 8-9 at night but ok now.  States no h.o. Dvt, immobilization, no dyspnea or sob.  States some swelling with left calf swollen.   Past Medical History:  Diagnosis Date  . Allergy   . Cataract    surgery to remove  . Diverticulosis   . Gallstones   . GERD (gastroesophageal reflux disease)   . Glaucoma    no med - just watching  . Hepatitis    as a child  . Hiatal hernia   . Osteoarthritis   . Psoriatic arthritis ([place])   . RA (rheumatoid arthritis) [facility])     Patient Active Problem List   Diagnosis Date Noted  . Right ureteral stone 05/20/2016  . Abdominal pain, epigastric 03/23/2015  . Cholelithiasis 03/23/2015  . Hiatal hernia 03/23/2015    Past Surgical History:  Procedure Laterality Date  . BUNIONECTOMY Right    with correction  . CESAREAN SECTION  10/21/79, 03/28/81   x 2  . CHOLECYSTECTOMY N/A 04/12/2015   Procedure: LAPAROSCOPIC CHOLECYSTECTOMY;  Surgeon: [name], MD;  Location: [facility];  Service: General;  Laterality: N/A;  . COLONOSCOPY  4/20006   Sharlett Iles  . DILATION AND CURETTAGE OF UTERUS    . EYE SURGERY Bilateral    cataract surgery w/ lens implant  . INTRAOCULAR LENS INSERTION Bilateral   . TIBIA FRACTURE SURGERY Right 1986   Bone transplant  . WISDOM TOOTH EXTRACTION      OB History    No data available       Home Medications    Prior to Admission medications   Medication Sig Start Date End Date Taking? Authorizing Provider  acetaminophen (TYLENOL) 650 MG CR tablet Take 1,300 mg by mouth every 8 (eight) hours as needed for pain.    Historical Provider, MD  Biotin 5 MG TABS Take 1 tablet by mouth at  bedtime.    Historical Provider, MD  calcium acetate (PHOSLO) 667 MG capsule Take 667 mg by mouth at bedtime.     Historical Provider, MD  Calcium Polycarbophil (FIBER) 625 MG TABS Take 1 tablet by mouth daily.    Historical Provider, MD  Doxylamine Succinate, Sleep, (SLEEP AID PO) Take 1 tablet by mouth at bedtime.     Historical Provider, MD  InFLIXimab (REMICADE IV) Inject into the vein See admin instructions. Every 8 weeks at Dr. [name] office - last infusion March 21, 2015    Historical Provider, MD  meloxicam (MOBIC) 15 MG tablet Take 15 mg by mouth at bedtime.     Historical Provider, MD  pantoprazole (PROTONIX) 20 MG tablet Take 1 tablet (20 mg total) by mouth daily. Patient taking differently: Take 20 mg by mouth at bedtime.  03/14/15   Ezequiel Essex, MD  Polyethyl Glycol-Propyl Glycol (SYSTANE OP) Place 1 drop into both eyes daily as needed (dry eyes).    Historical Provider, MD  traMADol (ULTRAM) 50 MG tablet Take 50 mg by mouth at bedtime.     Historical Provider, MD    Family History Family History  Problem Relation Age of Onset  . Diabetes Mother   .  Heart attack Mother   . Transient ischemic attack Mother   . Diabetes Brother   . Diabetes Maternal Grandmother   . Colon cancer Neg Hx   . Esophageal cancer Neg Hx   . Stomach cancer Neg Hx     Social History Social History  Substance Use Topics  . Smoking status: Never Smoker  . Smokeless tobacco: Never Used  . Alcohol use No     Allergies   Patient has no known allergies.   Review of Systems Review of Systems  All other systems reviewed and are negative.    Physical Exam Updated Vital Signs BP 162/92 (BP Location: Left Arm)   Pulse 101   Temp 99 F (37.2 C) (Oral)   Resp 18   SpO2 95%   Physical Exam  Constitutional: She is oriented to person, place, and time. She appears well-developed and well-nourished. No distress.  HENT:  Head: Normocephalic and atraumatic.  Right Ear: External ear  normal.  Left Ear: External ear normal.  Nose: Nose normal.  Eyes: Conjunctivae and EOM are normal. Pupils are equal, round, and reactive to light.  Neck: Normal range of motion. Neck supple.  Pulmonary/Chest: Effort normal.  Musculoskeletal: Normal range of motion.  Left lower leg with some swelling, lateral ttp, dp intact, no redness or warmth noted  Neurological: She is alert and oriented to person, place, and time. She exhibits normal muscle tone. Coordination normal.  Skin: Skin is warm and dry.  Psychiatric: She has a normal mood and affect. Her behavior is normal. Thought content normal.  Nursing note and vitals reviewed.    ED Treatments / Results  Labs (all labs ordered are listed, but only abnormal results are displayed) Labs Reviewed - No data to display  EKG  EKG Interpretation None       Radiology No results found.  Procedures Procedures (including critical care time)  Medications Ordered in ED Medications - No data to display   Initial Impression / Assessment and Plan / ED Course  I have reviewed the triage vital signs and the nursing notes.  Pertinent labs & imaging results that were available during my care of the patient were reviewed by me and considered in my medical decision making (see chart for details).     Plsan doppler left lower leg.   No evidence of clot seen on doppler Final Clinical Impressions(s) / ED Diagnoses   Final diagnoses:  Pain in left lower leg    New Prescriptions New Prescriptions   No medications on file     Pattricia Boss, MD 07/17/16 1615

## 2016-07-17 NOTE — ED Notes (Signed)
Updated on patient wait time

## 2016-07-17 NOTE — Discharge Instructions (Signed)
No evidence of blood clot is seen on doppler.

## 2016-07-17 NOTE — Progress Notes (Signed)
**  Preliminary report by tech**  Left lower extremity venous duplex complete. There is no evidence of deep or superficial vein thrombosis involving the left lower extremity. All visualized vessels appear patent and compressible. There is no evidence of a Baker's cyst on the left.  07/17/16 4:03 PM Phyllis Austin RVT

## 2016-07-17 NOTE — ED Triage Notes (Signed)
Pt reports pain and warmth in her left calf. Pt states that her MD sent her for evaluation of a possible blood clot.

## 2016-07-18 ENCOUNTER — Ambulatory Visit (INDEPENDENT_AMBULATORY_CARE_PROVIDER_SITE_OTHER): Payer: Medicare Other | Admitting: Family Medicine

## 2016-07-18 ENCOUNTER — Encounter: Payer: Self-pay | Admitting: Family Medicine

## 2016-07-18 DIAGNOSIS — M79605 Pain in left leg: Secondary | ICD-10-CM

## 2016-07-18 NOTE — Progress Notes (Signed)
PCP: Caffie Damme, MD  Subjective:   HPI: Patient is a 71 y.o. female here for left leg pain.  Patient reports she has had off and on left leg pain for years. Current pain started about 2 weeks ago and has been throbbing on outside of lower thigh and lower leg. Pain up to 10/10 but currently 0/10. No numbness or tingling. No back pain with this. She takes remicade, tylenol, tramadol, meloxicam. No acute injury prior to this. Worse if she is on her feet a lot. Wakes her up at night. No skin changes.  Past Medical History:  Diagnosis Date  . Allergy   . Cataract    surgery to remove  . Diverticulosis   . Gallstones   . GERD (gastroesophageal reflux disease)   . Glaucoma    no med - just watching  . Hepatitis    as a child  . Hiatal hernia   . Osteoarthritis   . Psoriatic arthritis ([place])   . RA (rheumatoid arthritis) ([place])     Current Outpatient Prescriptions on File Prior to Visit  Medication Sig Dispense Refill  . acetaminophen (TYLENOL) 650 MG CR tablet Take 1,300 mg by mouth every 8 (eight) hours as needed for pain.    . Biotin 5 MG TABS Take 1 tablet by mouth at bedtime.    . calcium acetate (PHOSLO) 667 MG capsule Take 667 mg by mouth at bedtime.     . Calcium Polycarbophil (FIBER) 625 MG TABS Take 1 tablet by mouth daily.    . Doxylamine Succinate, Sleep, (SLEEP AID PO) Take 1 tablet by mouth at bedtime.     . InFLIXimab (REMICADE IV) Inject into the vein See admin instructions. Every 8 weeks at Dr. [name] office - last infusion March 21, 2015    . meloxicam (MOBIC) 15 MG tablet Take 15 mg by mouth at bedtime.     . pantoprazole (PROTONIX) 20 MG tablet Take 1 tablet (20 mg total) by mouth daily. (Patient taking differently: Take 20 mg by mouth at bedtime. ) 30 tablet 0  . Polyethyl Glycol-Propyl Glycol (SYSTANE OP) Place 1 drop into both eyes daily as needed (dry eyes).    . traMADol (ULTRAM) 50 MG tablet Take 50 mg by mouth at bedtime.      No  current facility-administered medications on file prior to visit.     Past Surgical History:  Procedure Laterality Date  . BUNIONECTOMY Right    with correction  . CESAREAN SECTION  10/21/79, 03/28/81   x 2  . CHOLECYSTECTOMY N/A 04/12/2015   Procedure: LAPAROSCOPIC CHOLECYSTECTOMY;  Surgeon: [name], MD;  Location: [facility];  Service: General;  Laterality: N/A;  . COLONOSCOPY  4/20006   Sharlett Iles  . DILATION AND CURETTAGE OF UTERUS    . EYE SURGERY Bilateral    cataract surgery w/ lens implant  . INTRAOCULAR LENS INSERTION Bilateral   . TIBIA FRACTURE SURGERY Right 1986   Bone transplant  . WISDOM TOOTH EXTRACTION      No Known Allergies  Social History   Social History  . Marital status: Married    Spouse name: N/A  . Number of children: 2  . Years of education: N/A   Occupational History  . retired     Social History Main Topics  . Smoking status: Never Smoker  . Smokeless tobacco: Never Used  . Alcohol use No  . Drug use: No  . Sexual activity: Not on file   Other Topics  Concern  . Not on file   Social History Narrative  . No narrative on file    Family History  Problem Relation Age of Onset  . Diabetes Mother   . Heart attack Mother   . Transient ischemic attack Mother   . Diabetes Brother   . Diabetes Maternal Grandmother   . Colon cancer Neg Hx   . Esophageal cancer Neg Hx   . Stomach cancer Neg Hx     BP 132/82   Pulse 92   Ht 5\' 3"  (1.6 m)   Wt 202 lb (91.6 kg)   BMI 35.78 kg/m   Review of Systems: See HPI above.     Objective:  Physical Exam:  Gen: NAD, comfortable in exam room  Left leg: No gross deformity, swelling, bruising.  Varicosities throughout both lower extremities. TTP distal lateral hamstring and over peroneal musculature.  No IT band, knee joint, back or hip tenderness. FROM back, hip, knee, ankle without pain.  5-/5 strength with knee flexion.  5/5 other muscle groups. Negative thompsons. Negative ant/post  drawer, valgus/varus stress. NVI distally.   Assessment & Plan:  1. Left leg pain - doppler u/s in ED negative for DVT.  Pain localizes to distal lateral hamstring and peroneal muscles.  Would not expect an electrolyte abnormality to give her this locally.  No back pain, radicular symptoms either.  Consistent with simple spasms/strain.  Encouraged strengthening, compression sleeves/stockings.  F/u in 1 month.  Continue meloxicam, tramadol, tylenol.

## 2016-07-18 NOTE — Patient Instructions (Signed)
You are getting spasms, strain of your hamstring and peroneal muscles. Strengthening of these muscles is important to help prevent this. Work your way up eventually to being able to do 3 sets of 10 of the following: hamstring curls, hamstring swings, calf raises, and the theraband exercise. Compression sleeves to both areas or compression stockings will help also. It's unlikely you have an electrolyte abnormality to account for this (potassium, magnesium, calcium) given it's only in the one leg. You also don't have evidence of a pinched nerve and the test was normal in looking for a blood clot. Follow up with me in 1 month for reevaluation. Continue the tylenol, tramadol, meloxicam as you have been.

## 2016-07-23 DIAGNOSIS — M79605 Pain in left leg: Secondary | ICD-10-CM | POA: Insufficient documentation

## 2016-07-23 NOTE — Assessment & Plan Note (Signed)
doppler u/s in ED negative for DVT.  Pain localizes to distal lateral hamstring and peroneal muscles.  Would not expect an electrolyte abnormality to give her this locally.  No back pain, radicular symptoms either.  Consistent with simple spasms/strain.  Encouraged strengthening, compression sleeves/stockings.  F/u in 1 month.  Continue meloxicam, tramadol, tylenol.

## 2016-08-06 DIAGNOSIS — Z79899 Other long term (current) drug therapy: Secondary | ICD-10-CM | POA: Diagnosis not present

## 2016-08-06 DIAGNOSIS — L4050 Arthropathic psoriasis, unspecified: Secondary | ICD-10-CM | POA: Diagnosis not present

## 2016-08-21 DIAGNOSIS — L409 Psoriasis, unspecified: Secondary | ICD-10-CM | POA: Diagnosis not present

## 2016-08-21 DIAGNOSIS — M79605 Pain in left leg: Secondary | ICD-10-CM | POA: Diagnosis not present

## 2016-08-21 DIAGNOSIS — L4050 Arthropathic psoriasis, unspecified: Secondary | ICD-10-CM | POA: Diagnosis not present

## 2016-08-21 DIAGNOSIS — K219 Gastro-esophageal reflux disease without esophagitis: Secondary | ICD-10-CM | POA: Diagnosis not present

## 2016-08-21 DIAGNOSIS — M150 Primary generalized (osteo)arthritis: Secondary | ICD-10-CM | POA: Diagnosis not present

## 2016-08-21 DIAGNOSIS — Z79899 Other long term (current) drug therapy: Secondary | ICD-10-CM | POA: Diagnosis not present

## 2016-08-21 DIAGNOSIS — Z6836 Body mass index (BMI) 36.0-36.9, adult: Secondary | ICD-10-CM | POA: Diagnosis not present

## 2016-08-21 DIAGNOSIS — E669 Obesity, unspecified: Secondary | ICD-10-CM | POA: Diagnosis not present

## 2016-08-22 ENCOUNTER — Ambulatory Visit: Payer: Medicare Other | Admitting: Family Medicine

## 2016-10-09 DIAGNOSIS — L4050 Arthropathic psoriasis, unspecified: Secondary | ICD-10-CM | POA: Diagnosis not present

## 2016-12-04 DIAGNOSIS — Z79899 Other long term (current) drug therapy: Secondary | ICD-10-CM | POA: Diagnosis not present

## 2016-12-04 DIAGNOSIS — L4050 Arthropathic psoriasis, unspecified: Secondary | ICD-10-CM | POA: Diagnosis not present

## 2017-01-24 ENCOUNTER — Ambulatory Visit (HOSPITAL_COMMUNITY)
Admission: EM | Admit: 2017-01-24 | Discharge: 2017-01-24 | Disposition: A | Discharge status: Home / Self Care | Payer: Medicare Other | Attending: Emergency Medicine | Admitting: Emergency Medicine

## 2017-01-24 ENCOUNTER — Emergency Department (HOSPITAL_COMMUNITY)
Admission: EM | Admit: 2017-01-24 | Discharge: 2017-01-24 | Disposition: A | Discharge status: Home / Self Care | Payer: Medicare Other | Attending: Emergency Medicine | Admitting: Emergency Medicine

## 2017-01-24 ENCOUNTER — Emergency Department (HOSPITAL_BASED_OUTPATIENT_CLINIC_OR_DEPARTMENT_OTHER): Admit: 2017-01-24 | Discharge: 2017-01-24 | Disposition: A | Discharge status: Home / Self Care | Payer: Medicare Other

## 2017-01-24 ENCOUNTER — Encounter (HOSPITAL_COMMUNITY): Payer: Self-pay | Admitting: *Deleted

## 2017-01-24 ENCOUNTER — Encounter (HOSPITAL_COMMUNITY): Payer: Self-pay | Admitting: Physician Assistant

## 2017-01-24 DIAGNOSIS — Z79899 Other long term (current) drug therapy: Secondary | ICD-10-CM | POA: Insufficient documentation

## 2017-01-24 DIAGNOSIS — I809 Phlebitis and thrombophlebitis of unspecified site: Secondary | ICD-10-CM | POA: Insufficient documentation

## 2017-01-24 DIAGNOSIS — I803 Phlebitis and thrombophlebitis of lower extremities, unspecified: Secondary | ICD-10-CM

## 2017-01-24 DIAGNOSIS — M79609 Pain in unspecified limb: Secondary | ICD-10-CM | POA: Diagnosis not present

## 2017-01-24 DIAGNOSIS — M7989 Other specified soft tissue disorders: Secondary | ICD-10-CM | POA: Diagnosis not present

## 2017-01-24 DIAGNOSIS — F13930 Sedative, hypnotic or anxiolytic use, unspecified with withdrawal, uncomplicated: Secondary | ICD-10-CM | POA: Insufficient documentation

## 2017-01-24 DIAGNOSIS — M79661 Pain in right lower leg: Secondary | ICD-10-CM | POA: Diagnosis not present

## 2017-01-24 DIAGNOSIS — M79604 Pain in right leg: Secondary | ICD-10-CM | POA: Diagnosis present

## 2017-01-24 MED ORDER — ACETAMINOPHEN 500 MG PO TABS
1000.0000 mg | ORAL_TABLET | Freq: Once | ORAL | Status: DC
  Filled 2017-01-24: qty 2

## 2017-01-24 NOTE — ED Notes (Signed)
Patient transported to Ultrasound 

## 2017-01-24 NOTE — ED Triage Notes (Signed)
Pt reports    She   Banged  Her  r  Leg  On a  Car  Door        X  9  Days  Ago  She  Has  Pain  And  Swelling        To the  Affected  Leg  She  Has  Varicose  Veins   As   Well      She   Reports  Pain on palpation     She  denys  Any chest  Pain or shortness  Of breath  She is  Awake  And  Alert and oriented

## 2017-01-24 NOTE — ED Notes (Signed)
Pt returned from u/s

## 2017-01-24 NOTE — Discharge Instructions (Signed)
Go to the Emergency Department for further evaluation of pain and swelling of the right lower leg.

## 2017-01-24 NOTE — Progress Notes (Addendum)
**  Preliminary report by tech**  Right lower extremity venous duplex complete. There is no evidence of deep vein thrombosis involving the right lower extremity. There is evidence of acute superficial vein thrombosis involving the great saphenous vein, and several varicose veins in the right lower extremity. There is no evidence of a Baker's cyst on the right. Results were given to Margarita Mail PA.  01/24/17 12:24 PM Carlos Levering RVT

## 2017-01-24 NOTE — ED Provider Notes (Signed)
CSN: [id number]     Arrival date & time 01/24/17  1001 History   None    Chief Complaint  Patient presents with  . Leg Pain   (Consider location/radiation/quality/duration/timing/severity/associated sxs/prior Treatment) 71 year old female with history of RA, GERD, hiatal hernia, osteoarthritis, varicose veins, comes in for 9 day history of right leg pain and swelling after being hit by the door. She states it started out as a bruising on the right lateral lower extremity, right below the knee. She now has pain on on palpation of the injury spot, as well as along the back of her knee and up the medial side of her leg. There is redness along the medial side of her leg, and right lower leg swelling. She states that when she coughs, pain radiates up to her groin area. She has taken her tramadol, meloxicam, without relief. Has been doing warm compresses, with mild relief. Denies numbness, tingling. Denies chest pain, shortness of breath, wheezing. She had a DVT study 6 months ago for left lower leg swelling, which was negative at that time.      Past Medical History:  Diagnosis Date  . Allergy   . Cataract    surgery to remove  . Diverticulosis   . Gallstones   . GERD (gastroesophageal reflux disease)   . Glaucoma    no med - just watching  . Hepatitis    as a child  . Hiatal hernia   . Osteoarthritis   . Psoriatic arthritis ([place])   . RA (rheumatoid arthritis) ([place])    Past Surgical History:  Procedure Laterality Date  . BUNIONECTOMY Right    with correction  . CESAREAN SECTION  10/21/79, 03/28/81   x 2  . CHOLECYSTECTOMY N/A 04/12/2015   Procedure: LAPAROSCOPIC CHOLECYSTECTOMY;  Surgeon: [name], MD;  Location: [facility];  Service: General;  Laterality: N/A;  . COLONOSCOPY  4/20006   Sharlett Iles  . DILATION AND CURETTAGE OF UTERUS    . EYE SURGERY Bilateral    cataract surgery w/ lens implant  . INTRAOCULAR LENS INSERTION Bilateral   . TIBIA FRACTURE SURGERY Right 1986   Bone  transplant  . WISDOM TOOTH EXTRACTION     Family History  Problem Relation Age of Onset  . Diabetes Mother   . Heart attack Mother   . Transient ischemic attack Mother   . Diabetes Brother   . Diabetes Maternal Grandmother   . Colon cancer Neg Hx   . Esophageal cancer Neg Hx   . Stomach cancer Neg Hx    Social History  Substance Use Topics  . Smoking status: Never Smoker  . Smokeless tobacco: Never Used  . Alcohol use No   OB History    No data available     Review of Systems  Respiratory: Negative for cough, chest tightness, shortness of breath and wheezing.   Cardiovascular: Positive for leg swelling. Negative for chest pain and palpitations.  Musculoskeletal: Positive for joint swelling and myalgias. Negative for arthralgias and gait problem.    Allergies  Patient has no known allergies.  Home Medications   Prior to Admission medications   Medication Sig Start Date End Date Taking? Authorizing Provider  acetaminophen (TYLENOL) 650 MG CR tablet Take 1,300 mg by mouth every 8 (eight) hours as needed for pain.    [provider]  Biotin 5 MG TABS Take 1 tablet by mouth at bedtime.    [provider]  calcium acetate (PHOSLO) 667 MG capsule Take 667  mg by mouth at bedtime.     [provider]  Calcium Polycarbophil (FIBER) 625 MG TABS Take 1 tablet by mouth daily.    [provider]  Doxylamine Succinate, Sleep, (SLEEP AID PO) Take 1 tablet by mouth at bedtime.     [provider]  InFLIXimab (REMICADE IV) Inject into the vein See admin instructions. Every 8 weeks at Dr. [name] office - last infusion March 21, 2015    [provider]  meloxicam (MOBIC) 15 MG tablet Take 15 mg by mouth at bedtime.     [provider]  pantoprazole (PROTONIX) 20 MG tablet Take 1 tablet (20 mg total) by mouth daily. Patient taking differently: Take 20 mg by mouth at bedtime.  03/14/15   Rancour, Annie Main, MD  Polyethyl  Glycol-Propyl Glycol (SYSTANE OP) Place 1 drop into both eyes daily as needed (dry eyes).    [provider]  traMADol (ULTRAM) 50 MG tablet Take 50 mg by mouth at bedtime.     [provider]   Meds Ordered and Administered this Visit  Medications - No data to display  BP 131/66 (BP Location: Left Arm)   Pulse 92   Temp 98.4 F (36.9 C) (Oral)   Resp 18   SpO2 95%  No data found.   Physical Exam  Constitutional: She is oriented to person, place, and time. She appears well-developed and well-nourished. No distress.  HENT:  Head: Normocephalic and atraumatic.  Eyes: Pupils are equal, round, and reactive to light. Conjunctivae are normal.  Cardiovascular: Normal rate, regular rhythm and normal heart sounds.  Exam reveals no gallop and no friction rub.   No murmur heard. Pulmonary/Chest: Effort normal and breath sounds normal. No respiratory distress. She has no wheezes. She has no rales.  Musculoskeletal:  Bruising seen on right lateral lower extremity, below the knee. Swelling of the right lower extremity. Erythema with swelling of the medial leg directly superior to the knee. Varicose veins seen throughout lower extremity. Tenderness on palpation of the upper calf with palpable cords. Full range of motion of the knee. Sensation intact. Pedal pulses 2+ and equal bilaterally.  Positive homan's sign.   Neurological: She is alert and oriented to person, place, and time.  Skin: Skin is warm and dry.  Psychiatric: She has a normal mood and affect. Her behavior is normal. Judgment normal.    Urgent Care Course     Procedures (including critical care time)  Labs Review Labs Reviewed - No data to display  Imaging Review No results found.     MDM   1. Pain and swelling of right lower leg    Discussed with patient given history and exam, cannot rule out DVT. Patient to go to the ED for further workup and evaluation. Patient without chest pain, shortness of  breath, acute distress, stable to discharge to ED.  Discussed case with Dr Alphonzo Cruise, who agrees to plan.     Ok Edwards, PA-C 01/24/17 1421

## 2017-01-24 NOTE — ED Triage Notes (Signed)
Pt reports hitting right anterior lower leg on car door one week ago and still has pain and swelling to her leg. Sent here from [facility] for further eval.

## 2017-01-24 NOTE — ED Provider Notes (Signed)
[facility] DEPT Provider Note   CSN: [id number] Arrival date & time: 01/24/17  1106     History   Chief Complaint Chief Complaint  Patient presents with  . Leg Pain    HPI Phyllis Austin is a 71 y.o. female who presents with cc of left leg pain. Patient hit the Front of her R lower leg with the angle of her door 1 week ago. Initially She had a bruise. However, she has developed progressively worsening pain and a palpable cord around her leg near the popliteal fossa, radiating up the medial side of her thigh. She states it is warm, exquisitely tender to palpation. She has been using tramadol at home, but decided to have it evaluated today. He denies a history of DVT. She does have a history of varicose veins. She has noticed swelling in her right foot. She denies chest pain or shortness of breath. Patient with pmh of RA on remicade.  HPI  Past Medical History:  Diagnosis Date  . Allergy   . Cataract    surgery to remove  . Diverticulosis   . Gallstones   . GERD (gastroesophageal reflux disease)   . Glaucoma    no med - just watching  . Hepatitis    as a child  . Hiatal hernia   . Osteoarthritis   . Psoriatic arthritis ([place])   . RA (rheumatoid arthritis) [facility])     Patient Active Problem List   Diagnosis Date Noted  . Left leg pain 07/23/2016  . Right ureteral stone 05/20/2016  . Abdominal pain, epigastric 03/23/2015  . Cholelithiasis 03/23/2015  . Hiatal hernia 03/23/2015    Past Surgical History:  Procedure Laterality Date  . BUNIONECTOMY Right    with correction  . CESAREAN SECTION  10/21/79, 03/28/81   x 2  . CHOLECYSTECTOMY N/A 04/12/2015   Procedure: LAPAROSCOPIC CHOLECYSTECTOMY;  Surgeon: [name], MD;  Location: [facility];  Service: General;  Laterality: N/A;  . COLONOSCOPY  4/20006   Sharlett Iles  . DILATION AND CURETTAGE OF UTERUS    . EYE SURGERY Bilateral    cataract surgery w/ lens implant  . INTRAOCULAR LENS INSERTION Bilateral   . TIBIA  FRACTURE SURGERY Right 1986   Bone transplant  . WISDOM TOOTH EXTRACTION      OB History    No data available       Home Medications    Prior to Admission medications   Medication Sig Start Date End Date Taking? Authorizing Provider  acetaminophen (TYLENOL) 650 MG CR tablet Take 1,300 mg by mouth every 8 (eight) hours as needed for pain.   Yes [provider]  Biotin 5 MG TABS Take 1 tablet by mouth at bedtime.   Yes [provider]  calcium acetate (PHOSLO) 667 MG capsule Take 667 mg by mouth at bedtime.    Yes [provider]  Calcium Polycarbophil (FIBER) 625 MG TABS Take 1 tablet by mouth daily.   Yes [provider]  Doxylamine Succinate, Sleep, (SLEEP AID PO) Take 1 tablet by mouth at bedtime.    Yes [provider]  InFLIXimab (REMICADE IV) Inject into the vein See admin instructions. Every 8 weeks at Dr. [name] office - last infusion March 21, 2015   Yes [provider]  meloxicam (MOBIC) 15 MG tablet Take 15 mg by mouth at bedtime.    Yes [provider]  pantoprazole (PROTONIX) 20 MG tablet Take 1 tablet (20 mg total) by mouth daily. Patient  taking differently: Take 20 mg by mouth at bedtime.  03/14/15  Yes Rancour, Annie Main, MD  Polyethyl Glycol-Propyl Glycol (SYSTANE OP) Place 1 drop into both eyes daily as needed (dry eyes).   Yes [provider]  traMADol (ULTRAM) 50 MG tablet Take 50 mg by mouth at bedtime.    Yes [provider]    Family History Family History  Problem Relation Age of Onset  . Diabetes Mother   . Heart attack Mother   . Transient ischemic attack Mother   . Diabetes Brother   . Diabetes Maternal Grandmother   . Colon cancer Neg Hx   . Esophageal cancer Neg Hx   . Stomach cancer Neg Hx     Social History Social History  Substance Use Topics  . Smoking status: Never Smoker  . Smokeless tobacco: Never Used  . Alcohol use No     Allergies   Patient has  no known allergies.   Review of Systems Review of Systems Ten systems reviewed and are negative for acute change, except as noted in the HPI.    Physical Exam Updated Vital Signs BP 135/89 (BP Location: Left Arm)   Pulse 78   Temp 97.7 F (36.5 C) (Oral)   Resp 17   Ht 5\' 3"  (1.6 m)   Wt 90.7 kg (200 lb)   SpO2 96%   BMI 35.43 kg/m   Physical Exam  Constitutional: She is oriented to person, place, and time. She appears well-developed and well-nourished. No distress.  HENT:  Head: Normocephalic and atraumatic.  Eyes: Conjunctivae are normal. No scleral icterus.  Neck: Normal range of motion.  Cardiovascular: Normal rate, regular rhythm and normal heart sounds.  Exam reveals no gallop and no friction rub.   No murmur heard. Right lower extremity with firm, tender and warm, palpable cord from the lateral side inferior to the knee wrapping behind the region of the popliteal fossa and up the medial side of the thigh. There is mild swelling in the right foot about 1+ pitting edema  Pulmonary/Chest: Effort normal and breath sounds normal. No respiratory distress.  Abdominal: Soft. Bowel sounds are normal. She exhibits no distension and no mass. There is no tenderness. There is no guarding.  Neurological: She is alert and oriented to person, place, and time.  Skin: Skin is warm and dry. She is not diaphoretic.  Psychiatric: Her behavior is normal.  Nursing note and vitals reviewed.    ED Treatments / Results  Labs (all labs ordered are listed, but only abnormal results are displayed) Labs Reviewed - No data to display  EKG  EKG Interpretation None       Radiology No results found.  Procedures Procedures (including critical care time)  Medications Ordered in ED Medications - No data to display   Initial Impression / Assessment and Plan / ED Course  I have reviewed the triage vital signs and the nursing notes.  Pertinent labs & imaging results that were available  during my care of the patient were reviewed by me and considered in my medical decision making (see chart for details).    Patient with apparent uncomplicated benzodiazepine withdrawal. No evidence of infection. No diarrhea, vomiting, or she is present. Seen in shared visit with Dr. Laverta Baltimore. I have given the patient a dose of her prescribed oral medications. The pharmacy has her prescription filled and ready for pickup. Her family will be monitoring use from now on. She appears safe for discharge at this time.  Final Clinical Impressions(s) / ED Diagnoses   Final diagnoses:  Thrombophlebitis leg    New Prescriptions New Prescriptions   No medications on file     Margarita Mail, PA-C 01/24/17 1437    Long, Wonda Olds, MD 01/24/17 [phone]

## 2017-01-24 NOTE — Discharge Instructions (Signed)
Follow these instructions at home: Only take over-the-counter or prescription medicines as directed by your health care provider. Take all medicines exactly as prescribed. Raise (elevate) the affected area above the level of your heart as directed by your health care provider. Apply a warm compress or heating pad to the affected area as directed by your health care provider. Do not sleep with the heating pad. Use compression stockings or bandages as directed. These will speed healing and prevent the condition from coming back. If you are on blood thinners: Get follow-up blood tests as directed by your health care provider. Check with your health care provider before using any new medicines. Carry a medical alert card or wear your medical alert jewelry to show that you are on blood thinners. For phlebitis in the legs: Avoid prolonged standing or bed rest. Keep your legs moving. Raise your legs when sitting or lying. Do not smoke. Women, particularly those over the age of 35, should consider the risks and benefits of taking the contraceptive pill. This kind of hormone treatment can increase your risk for blood clots. Follow up with your health care provider as directed. Contact a health care provider if: You have unusual bruising or any bleeding problems. Your swelling or pain in the affected area is not improving. You are on anti-inflammatory medicine, and you develop belly (abdominal) pain. Get help right away if: You have a sudden onset of chest pain or difficulty breathing. You have a fever or persistent symptoms for more than 2-3 days. You have a fever and your symptoms suddenly get worse.

## 2017-01-28 ENCOUNTER — Other Ambulatory Visit: Payer: Self-pay

## 2017-01-28 DIAGNOSIS — L4050 Arthropathic psoriasis, unspecified: Secondary | ICD-10-CM | POA: Diagnosis not present

## 2017-01-28 DIAGNOSIS — I83891 Varicose veins of right lower extremities with other complications: Secondary | ICD-10-CM

## 2017-01-28 DIAGNOSIS — Z79899 Other long term (current) drug therapy: Secondary | ICD-10-CM | POA: Diagnosis not present

## 2017-03-19 ENCOUNTER — Ambulatory Visit (INDEPENDENT_AMBULATORY_CARE_PROVIDER_SITE_OTHER): Payer: Medicare Other | Admitting: Vascular Surgery

## 2017-03-19 ENCOUNTER — Ambulatory Visit (HOSPITAL_COMMUNITY)
Admission: RE | Admit: 2017-03-19 | Discharge: 2017-03-19 | Disposition: A | Discharge status: Home / Self Care | Payer: Medicare Other | Source: Ambulatory Visit | Attending: Vascular Surgery | Admitting: Vascular Surgery

## 2017-03-19 ENCOUNTER — Encounter: Payer: Self-pay | Admitting: Vascular Surgery

## 2017-03-19 VITALS — BP 148/90 | HR 83 | Temp 97.6°F | Ht 63.0 in | Wt 204.0 lb

## 2017-03-19 DIAGNOSIS — R609 Edema, unspecified: Secondary | ICD-10-CM | POA: Diagnosis not present

## 2017-03-19 DIAGNOSIS — I82811 Embolism and thrombosis of superficial veins of right lower extremities: Secondary | ICD-10-CM | POA: Diagnosis not present

## 2017-03-19 DIAGNOSIS — I8001 Phlebitis and thrombophlebitis of superficial vessels of right lower extremity: Secondary | ICD-10-CM

## 2017-03-19 DIAGNOSIS — I872 Venous insufficiency (chronic) (peripheral): Secondary | ICD-10-CM

## 2017-03-19 DIAGNOSIS — I83891 Varicose veins of right lower extremities with other complications: Secondary | ICD-10-CM

## 2017-03-19 NOTE — Progress Notes (Signed)
Patient name: Phyllis Austin MRN: [id number] DOB: 11-Oct-1945 Sex: female   REASON FOR CONSULT:    Thrombophlebitis right lower extremity. The consult is requested by the emergency department.  HPI:   Phyllis Austin is a pleasant 71 y.o. female,  Who has a long history of varicose veins of both lower extremities. She began having problems and she was a teenager. She denies any previous history of DVT. She was seen in the emergency department in August with pain in her medial right thigh and was found to have superficial thrombophlebitis. The pain has been fairly persistent and is aggravated when her dog rubs up against it. Her symptoms are alleviated somewhat with leg elevation and warm compresses. She does not describe any worsening leg swelling. She denies any significant chest pain or pleuritic chest pain.  She does describe some aching pain and heaviness in her lower extremity's which is aggravated by standing and relieved with elevation.  I reviewed the records from the emergency department on 01/24/2017. The patient had presented with leg pain and had palpable thrombophlebitis in the right lower extremity. The patient was set up for an outpatient visit.  Past Medical History:  Diagnosis Date  . Allergy   . Cataract    surgery to remove  . Diverticulosis   . Gallstones   . GERD (gastroesophageal reflux disease)   . Glaucoma    no med - just watching  . Hepatitis    as a child  . Hiatal hernia   . Osteoarthritis   . Psoriatic arthritis ([place])   . RA (rheumatoid arthritis) ([facility])     Family History  Problem Relation Age of Onset  . Diabetes Mother   . Heart attack Mother   . Transient ischemic attack Mother   . Diabetes Brother   . Diabetes Maternal Grandmother   . Colon cancer Neg Hx   . Esophageal cancer Neg Hx   . Stomach cancer Neg Hx     SOCIAL HISTORY: Social History   Social History  . Marital status: Married    Spouse name: N/A  . Number of children:  2  . Years of education: N/A   Occupational History  . retired     Social History Main Topics  . Smoking status: Never Smoker  . Smokeless tobacco: Never Used  . Alcohol use No  . Drug use: No  . Sexual activity: Not on file   Other Topics Concern  . Not on file   Social History Narrative  . No narrative on file    No Known Allergies  Current Outpatient Prescriptions  Medication Sig Dispense Refill  . acetaminophen (TYLENOL) 650 MG CR tablet Take 1,300 mg by mouth every 8 (eight) hours as needed for pain.    . Biotin 5 MG TABS Take 1 tablet by mouth at bedtime.    . calcium acetate (PHOSLO) 667 MG capsule Take 667 mg by mouth at bedtime.     . Calcium Polycarbophil (FIBER) 625 MG TABS Take 1 tablet by mouth daily.    . Doxylamine Succinate, Sleep, (SLEEP AID PO) Take 1 tablet by mouth at bedtime.     . InFLIXimab (REMICADE IV) Inject into the vein See admin instructions. Every 8 weeks at Dr. [name] office - last infusion March 21, 2015    . meloxicam (MOBIC) 15 MG tablet Take 15 mg by mouth at bedtime.     . pantoprazole (PROTONIX) 20 MG tablet Take 1 tablet (20 mg  total) by mouth daily. (Patient taking differently: Take 20 mg by mouth at bedtime. ) 30 tablet 0  . Polyethyl Glycol-Propyl Glycol (SYSTANE OP) Place 1 drop into both eyes daily as needed (dry eyes).    . traMADol (ULTRAM) 50 MG tablet Take 50 mg by mouth at bedtime.      No current facility-administered medications for this visit.     REVIEW OF SYSTEMS:  [X]  denotes positive finding, [ ]  denotes negative finding Cardiac  Comments:  Chest pain or chest pressure:    Shortness of breath upon exertion:    Short of breath when lying flat:    Irregular heart rhythm:        Vascular    Pain in calf, thigh, or hip brought on by ambulation:    Pain in feet at night that wakes you up from your sleep:     Blood clot in your veins: X   Leg swelling:         Pulmonary    Oxygen at home:    Productive  cough:     Wheezing:         Neurologic    Sudden weakness in arms or legs:     Sudden numbness in arms or legs:     Sudden onset of difficulty speaking or slurred speech:    Temporary loss of vision in one eye:     Problems with dizziness:         Gastrointestinal    Blood in stool:     Vomited blood:         Genitourinary    Burning when urinating:     Blood in urine:        Psychiatric    Major depression:         Hematologic    Bleeding problems:    Problems with blood clotting too easily:        Skin    Rashes or ulcers:        Constitutional    Fever or chills:     PHYSICAL EXAM:   Vitals:   03/19/17 1048  BP: (!) 148/90  Pulse: 83  Temp: 97.6 F (36.4 C)  TempSrc: Oral  SpO2: 97%  Weight: 224 lb (101.6 kg)  Height: 5\' 3"  (1.6 m)    GENERAL: The patient is a well-nourished female, in no acute distress. The vital signs are documented above. CARDIAC: There is a regular rate and rhythm.  VASCULAR: I do not detect carotid bruits. She has palpable dorsalis pedis and posterior tibial pulses bilaterally. She has mild bilateral lower extremity swelling. She has telangiectasias and reticular veins bilaterally. There is some induration over her medial right thigh where she has the superficial thrombophlebitis. PULMONARY: There is good air exchange bilaterally without wheezing or rales. ABDOMEN: Soft and non-tender with normal pitched bowel sounds.  MUSCULOSKELETAL: There are no major deformities or cyanosis. NEUROLOGIC: No focal weakness or paresthesias are detected. SKIN: There are no ulcers or rashes noted. PSYCHIATRIC: The patient has a normal affect.  DATA:    Right lower extremity venous duplex: I have independently interpreted her right lower extremity venous duplex scan. There is no evidence of deep venous thrombosis on the right. There is clot in the right great saphenous vein from the proximal thigh to the mid calf. There is also some deep venous  reflux noted in the common femoral vein and femoral vein. In addition the saphenofemoral junction and great saphenous vein have  incompetent segments.  I also reviewed to previous venous duplex scans. The patient had a left lower extremity venous duplex scan on 07/17/2016. This showed no evidence of DVT or superficial venous thrombosis in the left lower extremity.  Venous duplex scan done on 01/24/2017 of the right lower extremity, showed no evidence of DVT in the right lower extremity. There was clot in the right great saphenous vein and several varicose veins of the right lower extremity.  MEDICAL ISSUES:   SUPERFICIAL THROMBOPHLEBITIS RIGHT LOWER EXTREMITY: She has resolving superficial thrombophlebitis of the right great saphenous vein and some varicose veins of her right leg. We have encouraged her to continue elevation and warm compresses. She does have reflux above the segment but is not a candidate for laser ablation given the active superficial thrombophlebitis.   CHRONIC VENOUS INSUFFICIENCY: Her noninvasive study does show evidence of significant chronic venous insufficiency on the right with reflux in the deep system and the great saphenous vein. I have encouraged her to elevate her legs and we have discussed the proper positioning for this. In addition I have given her a prescription for knee-high compression stockings with a mild gradient once her phlebitis has resolved. I encouraged her to avoid prolonged sitting and standing and exercise as much as possible. We also discussed water aerobics. I plan on seeing her back 1 year and I have ordered a duplex scan at that time. If her chronic venous insufficiency progresses, then we could try her in thigh-high stockings with a gradient of 20-30. If this were not successful, she could potential B considered for laser ablation of the right great saphenous vein.  Deitra Mayo [facility] [phone]

## 2017-03-20 DIAGNOSIS — E669 Obesity, unspecified: Secondary | ICD-10-CM | POA: Diagnosis not present

## 2017-03-20 DIAGNOSIS — Z79899 Other long term (current) drug therapy: Secondary | ICD-10-CM | POA: Diagnosis not present

## 2017-03-20 DIAGNOSIS — M79605 Pain in left leg: Secondary | ICD-10-CM | POA: Diagnosis not present

## 2017-03-20 DIAGNOSIS — K219 Gastro-esophageal reflux disease without esophagitis: Secondary | ICD-10-CM | POA: Diagnosis not present

## 2017-03-20 DIAGNOSIS — M150 Primary generalized (osteo)arthritis: Secondary | ICD-10-CM | POA: Diagnosis not present

## 2017-03-20 DIAGNOSIS — L409 Psoriasis, unspecified: Secondary | ICD-10-CM | POA: Diagnosis not present

## 2017-03-20 DIAGNOSIS — Z6835 Body mass index (BMI) 35.0-35.9, adult: Secondary | ICD-10-CM | POA: Diagnosis not present

## 2017-03-20 DIAGNOSIS — L4050 Arthropathic psoriasis, unspecified: Secondary | ICD-10-CM | POA: Diagnosis not present

## 2017-03-21 NOTE — Addendum Note (Signed)
Addended by: Lianne Cure A on: 03/21/2017 01:13 PM   Modules accepted: Orders

## 2017-03-25 DIAGNOSIS — L4050 Arthropathic psoriasis, unspecified: Secondary | ICD-10-CM | POA: Diagnosis not present

## 2017-05-01 DIAGNOSIS — Z1231 Encounter for screening mammogram for malignant neoplasm of breast: Secondary | ICD-10-CM | POA: Diagnosis not present

## 2017-05-09 DIAGNOSIS — Z23 Encounter for immunization: Secondary | ICD-10-CM | POA: Diagnosis not present

## 2017-05-26 DIAGNOSIS — L4050 Arthropathic psoriasis, unspecified: Secondary | ICD-10-CM | POA: Diagnosis not present

## 2017-07-03 IMAGING — CT CT ABD-PELV W/ CM
2 of 5 series · 16 of 46 positions shown, 18 images · IV contrast (iopamidol)
Comparison: 03/14/2015

CLINICAL DATA: Right-sided abdominal pain.  Rule out appendicitis.

EXAM:
CT ABDOMEN AND PELVIS WITH CONTRAST
TECHNIQUE: Multidetector CT imaging of the abdomen and pelvis was performed
using the standard protocol following bolus administration of
intravenous contrast.
CONTRAST:  100mL HIG47M-433 IOPAMIDOL (HIG47M-433) INJECTION 61%

[Series 2: a/p w/ 5mm · axial · 0.88mm/px · z∈[+683,+1093]mm · 13 of 92 slices shown, 15 images]
[im 5/92  soft-tissue]
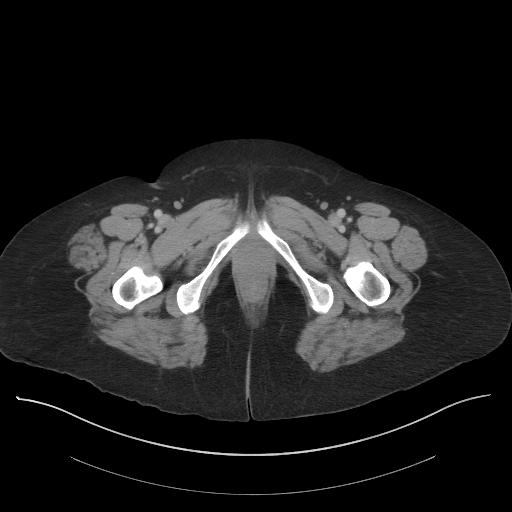
[im 5/92  bone]
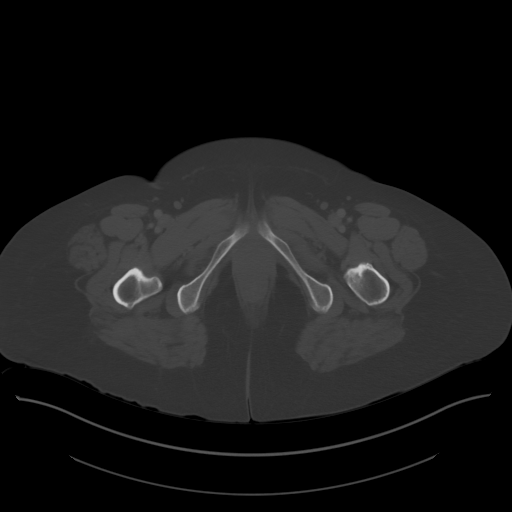
[im 15/92  soft-tissue]
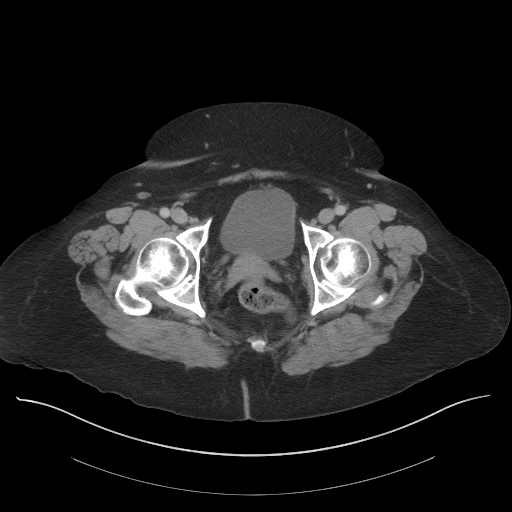
[im 20/92  soft-tissue]
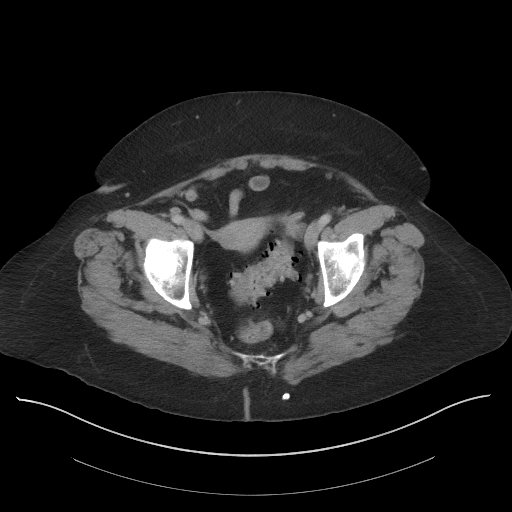
[im 24/92  soft-tissue]
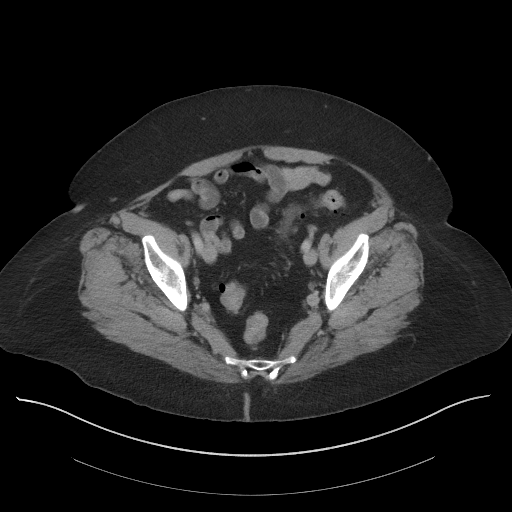
[im 34/92  soft-tissue]
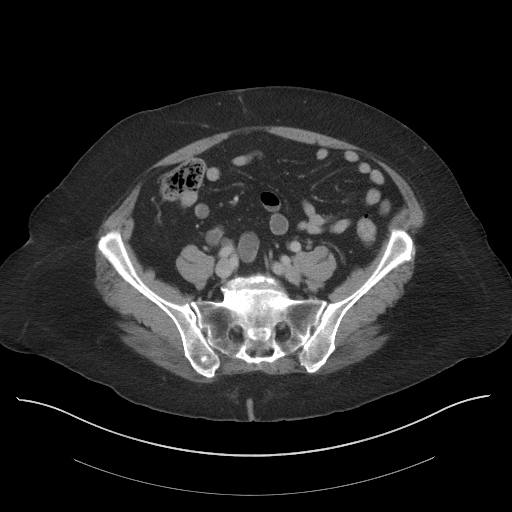
[im 39/92  soft-tissue]
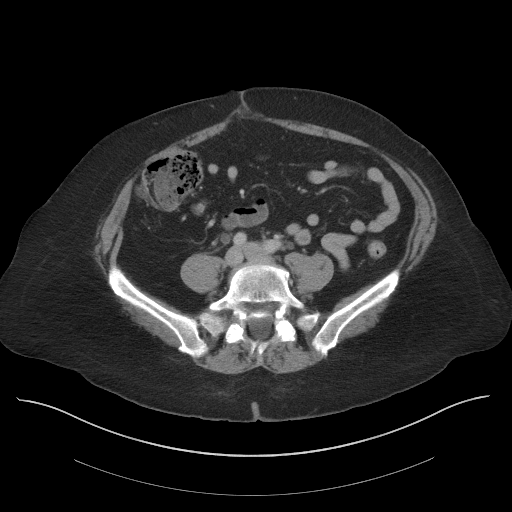
[im 48/92  soft-tissue]
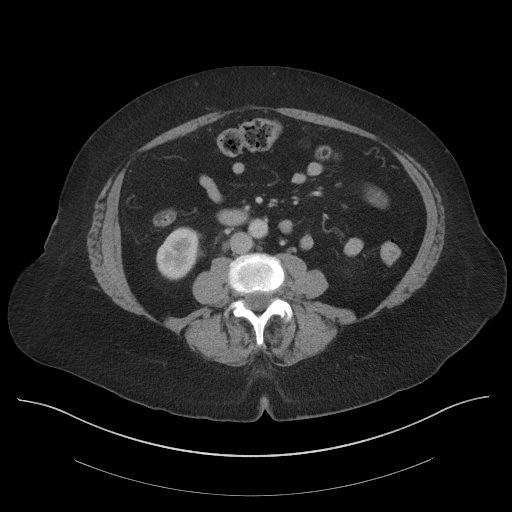
[im 53/92  soft-tissue]
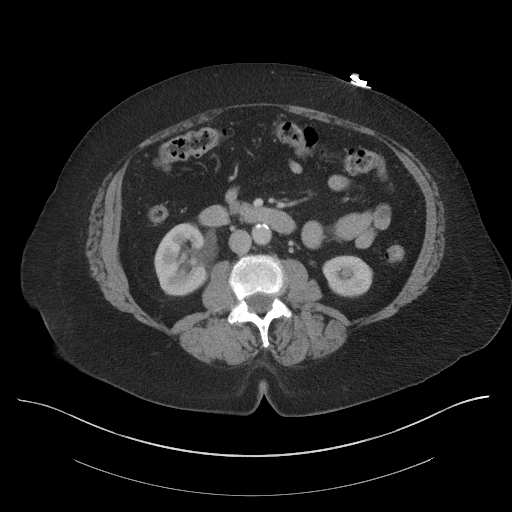
[im 58/92  soft-tissue]
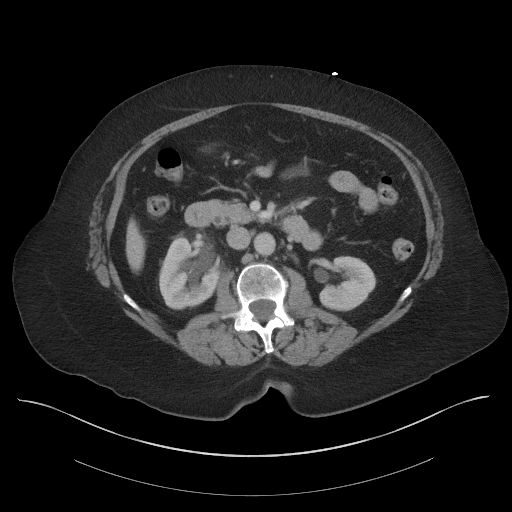
[im 58/92  bone]
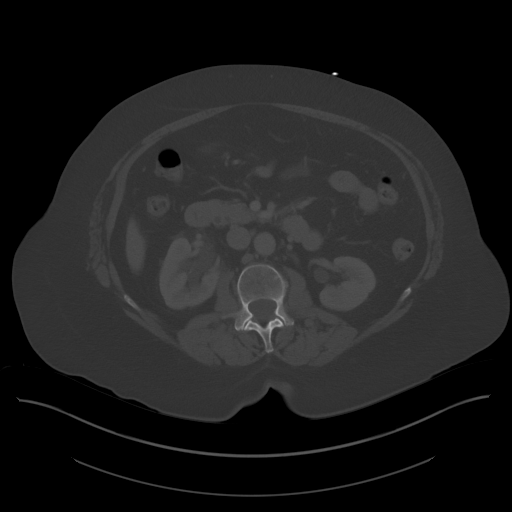
[im 68/92  soft-tissue]
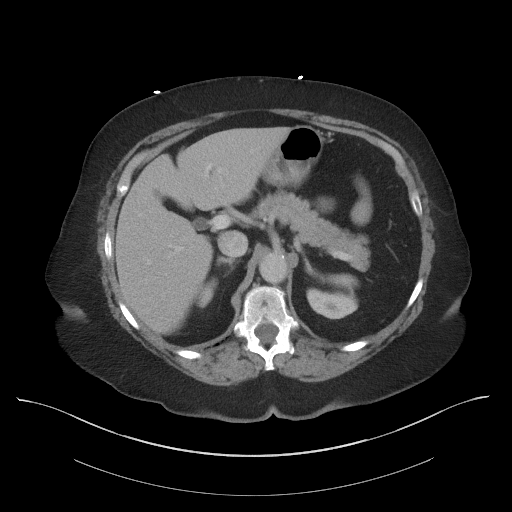
[im 72/92  soft-tissue]
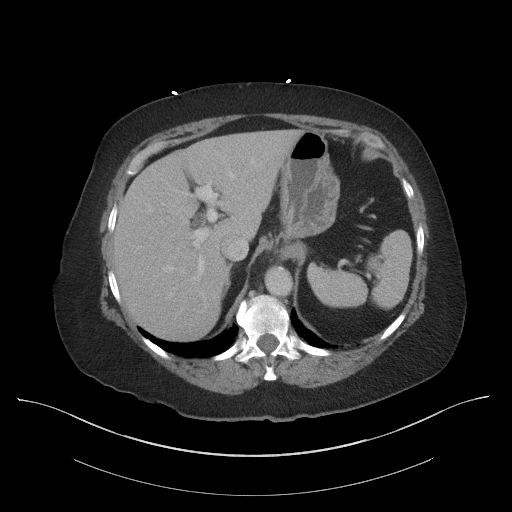
[im 77/92  soft-tissue]
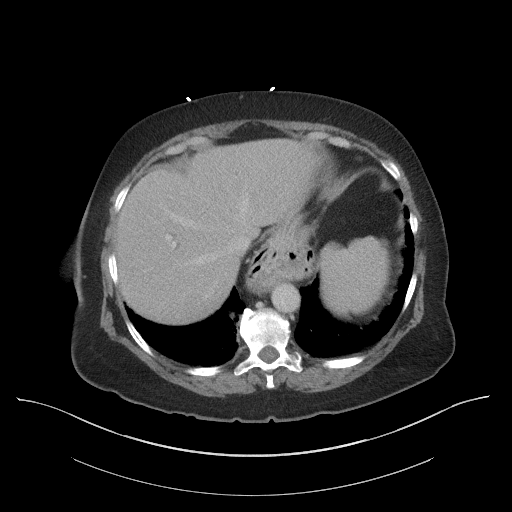
[im 87/92  soft-tissue]
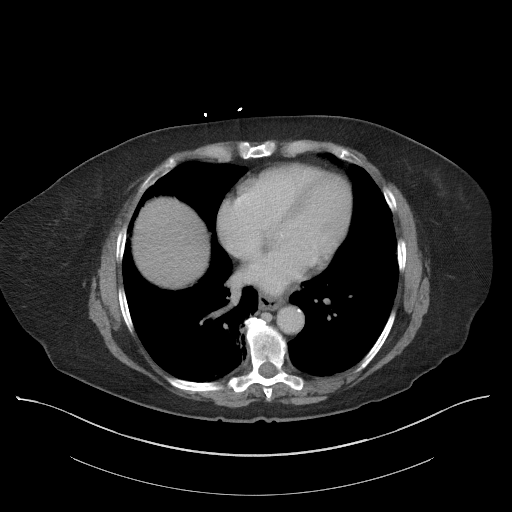

[Series 5: a/p w/ cor · coronal · 0.90mm/px · 3 of 147 slices shown]
[im 49/147  soft-tissue]
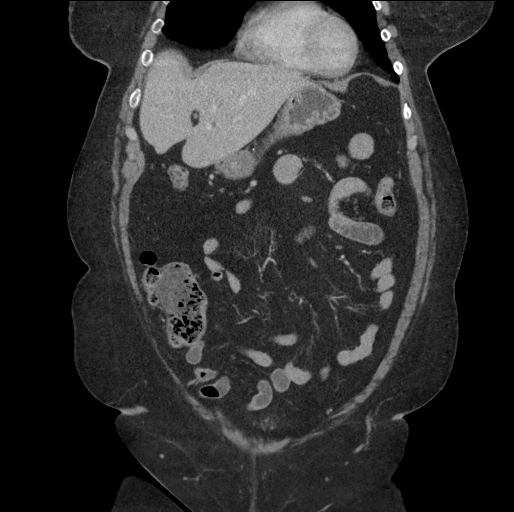
[im 65/147  soft-tissue]
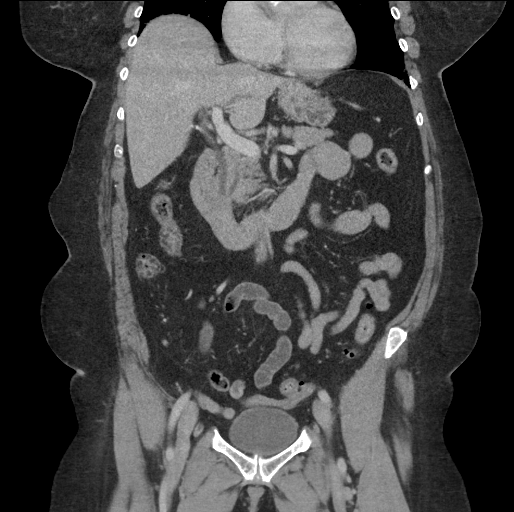
[im 82/147  soft-tissue]
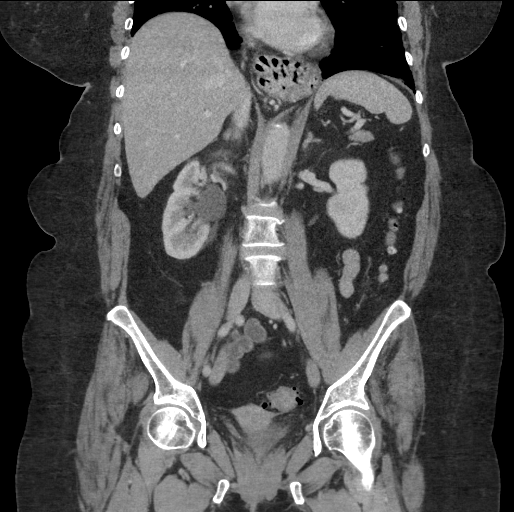

[16 of 46 positions shown; findings below may reference images not displayed]

FINDINGS: Lower chest: Dependent atelectasis. Bronchiectasis at the medial
right lung base. These findings are stable. There is a partially
imaged 2.0 cm nodular density in the medial right lower lobe on
image 1.

Hepatobiliary: Postcholecystectomy. Unremarkable liver. Expected
biliary dilatation.

Pancreas: Unremarkable.

Spleen: Unremarkable.

Adrenals/Urinary Tract: 3 mm calculus is present in the distal right
ureter at the right ureterovesical junction. This is associated with
right hydroureter and moderate right hydronephrosis. There is a
slightly delayed nephrogram in the right kidney compare without of
the left. There is stranding about the right renal pelvis. These are
all secondary findings of ureteral obstruction. No solid mass.
Normal adrenal glands.

Stomach/Bowel: Large hiatal hernia contains portions of the stomach.
Normal appendix. Diverticulosis of the descending and sigmoid colon.
There is no evidence of acute diverticulitis. No evidence of
small-bowel obstruction.

Vascular/Lymphatic: Minimal aortic atherosclerotic calcifications.
No aneurysm. No abnormal adenopathy.

Reproductive: Uterus and adnexa are unremarkable.

Other: No free-fluid

Musculoskeletal: Bilateral L5 pars defects. Grade 2 L5-S1
spondylolisthesis. It is stable.
IMPRESSION: 3 mm right ureteral vesicle junction calculus is associated with
secondary findings of right ureteral obstruction

Postcholecystectomy

Sigmoid diverticulosis.

Partially imaged 2.0 cm nodular density in the right lower lobe.
Dedicated CT chest is recommended to further characterize.

## 2017-07-15 DIAGNOSIS — M150 Primary generalized (osteo)arthritis: Secondary | ICD-10-CM | POA: Diagnosis not present

## 2017-07-15 DIAGNOSIS — L4050 Arthropathic psoriasis, unspecified: Secondary | ICD-10-CM | POA: Diagnosis not present

## 2017-07-15 DIAGNOSIS — Z79899 Other long term (current) drug therapy: Secondary | ICD-10-CM | POA: Diagnosis not present

## 2017-09-16 DIAGNOSIS — L4050 Arthropathic psoriasis, unspecified: Secondary | ICD-10-CM | POA: Diagnosis not present

## 2017-10-10 ENCOUNTER — Emergency Department (HOSPITAL_COMMUNITY)
Admission: EM | Admit: 2017-10-10 | Discharge: 2017-10-11 | Disposition: A | Discharge status: Home / Self Care | Payer: Medicare Other | Attending: Emergency Medicine | Admitting: Emergency Medicine

## 2017-10-10 ENCOUNTER — Emergency Department (HOSPITAL_COMMUNITY): Payer: Medicare Other

## 2017-10-10 ENCOUNTER — Other Ambulatory Visit: Payer: Self-pay

## 2017-10-10 DIAGNOSIS — Z79899 Other long term (current) drug therapy: Secondary | ICD-10-CM | POA: Insufficient documentation

## 2017-10-10 DIAGNOSIS — R0602 Shortness of breath: Secondary | ICD-10-CM | POA: Diagnosis not present

## 2017-10-10 DIAGNOSIS — J209 Acute bronchitis, unspecified: Secondary | ICD-10-CM | POA: Diagnosis not present

## 2017-10-10 DIAGNOSIS — R05 Cough: Secondary | ICD-10-CM | POA: Diagnosis not present

## 2017-10-10 LAB — CBC WITH DIFFERENTIAL/PLATELET
BASOS PCT: 0 %
Basophils Absolute: 0 10*3/uL (ref 0.0–0.1)
EOS ABS: 0.1 10*3/uL (ref 0.0–0.7)
Eosinophils Relative: 2 %
HCT: 41.2 % (ref 36.0–46.0)
HEMOGLOBIN: 13.3 g/dL (ref 12.0–15.0)
LYMPHS ABS: 2.3 10*3/uL (ref 0.7–4.0)
Lymphocytes Relative: 28 %
MCH: 30.2 pg (ref 26.0–34.0)
MCHC: 32.3 g/dL (ref 30.0–36.0)
MCV: 93.6 fL (ref 78.0–100.0)
Monocytes Absolute: 0.5 10*3/uL (ref 0.1–1.0)
Monocytes Relative: 6 %
NEUTROS PCT: 64 %
Neutro Abs: 5.4 10*3/uL (ref 1.7–7.7)
Platelets: 285 10*3/uL (ref 150–400)
RBC: 4.4 MIL/uL (ref 3.87–5.11)
RDW: 12.8 % (ref 11.5–15.5)
WBC: 8.4 10*3/uL (ref 4.0–10.5)

## 2017-10-10 LAB — I-STAT CG4 LACTIC ACID, ED: LACTIC ACID, VENOUS: 1.44 mmol/L (ref 0.5–1.9)

## 2017-10-10 LAB — COMPREHENSIVE METABOLIC PANEL
ALT: 15 U/L (ref 14–54)
AST: 26 U/L (ref 15–41)
Albumin: 3.4 g/dL — ABNORMAL LOW (ref 3.5–5.0)
Alkaline Phosphatase: 57 U/L (ref 38–126)
Anion gap: 10 (ref 5–15)
BUN: 14 mg/dL (ref 6–20)
CALCIUM: 8.7 mg/dL — AB (ref 8.9–10.3)
CHLORIDE: 100 mmol/L — AB (ref 101–111)
CO2: 27 mmol/L (ref 22–32)
CREATININE: 0.78 mg/dL (ref 0.44–1.00)
Glucose, Bld: 105 mg/dL — ABNORMAL HIGH (ref 65–99)
Potassium: 4 mmol/L (ref 3.5–5.1)
Sodium: 137 mmol/L (ref 135–145)
TOTAL PROTEIN: 7.4 g/dL (ref 6.5–8.1)
Total Bilirubin: 0.5 mg/dL (ref 0.3–1.2)

## 2017-10-10 MED ORDER — ALBUTEROL SULFATE (2.5 MG/3ML) 0.083% IN NEBU
5.0000 mg | INHALATION_SOLUTION | Freq: Once | RESPIRATORY_TRACT | Status: AC
  Administered 2017-10-10: 5 mg via RESPIRATORY_TRACT
  Filled 2017-10-10: qty 6

## 2017-10-10 MED ORDER — ACETAMINOPHEN 325 MG PO TABS
650.0000 mg | ORAL_TABLET | Freq: Once | ORAL | Status: AC | PRN
  Administered 2017-10-10: 650 mg via ORAL
  Filled 2017-10-10: qty 2

## 2017-10-10 NOTE — ED Triage Notes (Signed)
Patient c/o body aches, cough, fevel, shortness of breath x6 weeks. 101 temp in triage.

## 2017-10-11 DIAGNOSIS — J209 Acute bronchitis, unspecified: Secondary | ICD-10-CM | POA: Diagnosis not present

## 2017-10-11 MED ORDER — DOXYCYCLINE HYCLATE 100 MG PO CAPS: 100.0000 mg | ORAL_CAPSULE | Freq: Two times a day (BID) | ORAL | 0 refills | Status: DC

## 2017-10-11 MED ORDER — ALBUTEROL SULFATE (2.5 MG/3ML) 0.083% IN NEBU
5.0000 mg | INHALATION_SOLUTION | Freq: Once | RESPIRATORY_TRACT | Status: AC
  Administered 2017-10-11: 5 mg via RESPIRATORY_TRACT
  Filled 2017-10-11: qty 6

## 2017-10-11 MED ORDER — IPRATROPIUM BROMIDE 0.02 % IN SOLN
0.5000 mg | Freq: Once | RESPIRATORY_TRACT | Status: AC
  Administered 2017-10-11: 0.5 mg via RESPIRATORY_TRACT
  Filled 2017-10-11: qty 2.5

## 2017-10-11 MED ORDER — ALBUTEROL SULFATE HFA 108 (90 BASE) MCG/ACT IN AERS: 2.0000 | INHALATION_SPRAY | RESPIRATORY_TRACT | 0 refills | Status: DC | PRN

## 2017-10-11 NOTE — ED Provider Notes (Signed)
[facility] EMERGENCY DEPARTMENT Provider Note   CSN: [id number] Arrival date & time: 10/10/17  2025     History   Chief Complaint Chief Complaint  Patient presents with  . Shortness of Breath    HPI Phyllis Austin is a 72 y.o. female.  HPI Patient is a 72 year old female presents the emergency department complaints of 3-4 weeks of ongoing cough and congestion.  No history of asthma or COPD.  No history of heart failure.  Denies orthopnea.  No new edema in her legs.  No history of DVT or pulmonary embolism.  She states that she is tried over-the-counter cough medications without improvement in her symptoms.  She has not seen a primary care physician.  She has not been on antibiotics.  She has not tried bronchodilators.  Her symptoms are mild in severity.  Her cough is keeping her up at night.  No fevers.  No back pain.  No chest pain.  No other complaints.   Past Medical History:  Diagnosis Date  . Allergy   . Cataract    surgery to remove  . Diverticulosis   . Gallstones   . GERD (gastroesophageal reflux disease)   . Glaucoma    no med - just watching  . Hepatitis    as a child  . Hiatal hernia   . Osteoarthritis   . Psoriatic arthritis ([place])   . RA (rheumatoid arthritis) [facility])     Patient Active Problem List   Diagnosis Date Noted  . Left leg pain 07/23/2016  . Right ureteral stone 05/20/2016  . Abdominal pain, epigastric 03/23/2015  . Cholelithiasis 03/23/2015  . Hiatal hernia 03/23/2015    Past Surgical History:  Procedure Laterality Date  . BUNIONECTOMY Right    with correction  . CESAREAN SECTION  10/21/79, 03/28/81   x 2  . CHOLECYSTECTOMY N/A 04/12/2015   Procedure: LAPAROSCOPIC CHOLECYSTECTOMY;  Surgeon: [name], MD;  Location: [facility];  Service: General;  Laterality: N/A;  . COLONOSCOPY  4/20006   Sharlett Iles  . DILATION AND CURETTAGE OF UTERUS    . EYE SURGERY Bilateral    cataract surgery w/ lens implant  . INTRAOCULAR  LENS INSERTION Bilateral   . TIBIA FRACTURE SURGERY Right 1986   Bone transplant  . WISDOM TOOTH EXTRACTION       OB History   None      Home Medications    Prior to Admission medications   Medication Sig Start Date End Date Taking? Authorizing Provider  acetaminophen (TYLENOL) 650 MG CR tablet Take 1,300 mg by mouth every 8 (eight) hours as needed for pain.   Yes [provider]  Biotin 5 MG TABS Take 1 tablet by mouth at bedtime.   Yes [provider]  calcium acetate (PHOSLO) 667 MG capsule Take 667 mg by mouth at bedtime.    Yes [provider]  Calcium Polycarbophil (FIBER) 625 MG TABS Take 1 tablet by mouth at bedtime.    Yes [provider]  Doxylamine Succinate, Sleep, (SLEEP AID PO) Take 1 tablet by mouth at bedtime.    Yes [provider]  InFLIXimab (REMICADE IV) Inject into the vein See admin instructions. Every 8 weeks at Dr. [name] office   Yes [provider]  meloxicam (MOBIC) 15 MG tablet Take 15 mg by mouth at bedtime.    Yes [provider]  pantoprazole (PROTONIX) 20 MG tablet Take 1 tablet (20 mg total) by mouth daily. Patient taking  differently: Take 20 mg by mouth at bedtime.  03/14/15  Yes Rancour, Annie Main, MD  Polyethyl Glycol-Propyl Glycol (SYSTANE OP) Place 1 drop into both eyes daily as needed (dry eyes).   Yes [provider]  traMADol (ULTRAM) 50 MG tablet Take 50 mg by mouth at bedtime.    Yes [provider]  albuterol (PROVENTIL HFA;VENTOLIN HFA) 108 (90 Base) MCG/ACT inhaler Inhale 2 puffs into the lungs every 4 (four) hours as needed for wheezing or shortness of breath. 10/11/17   Jola Schmidt, MD  doxycycline (VIBRAMYCIN) 100 MG capsule Take 1 capsule (100 mg total) by mouth 2 (two) times daily. 10/11/17   Jola Schmidt, MD    Family History Family History  Problem Relation Age of Onset  . Diabetes Mother   . Heart attack Mother   . Transient ischemic attack Mother    . Diabetes Brother   . Diabetes Maternal Grandmother   . Colon cancer Neg Hx   . Esophageal cancer Neg Hx   . Stomach cancer Neg Hx     Social History Social History   Tobacco Use  . Smoking status: Never Smoker  . Smokeless tobacco: Never Used  Substance Use Topics  . Alcohol use: No    Alcohol/week: 0.0 oz  . Drug use: No     Allergies   Patient has no known allergies.   Review of Systems Review of Systems  All other systems reviewed and are negative.    Physical Exam Updated Vital Signs BP 126/60   Pulse 99   Temp 98.5 F (36.9 C) (Oral)   Resp (!) 26   Ht 5\' 4"  (1.626 m)   Wt 81.6 kg (180 lb)   SpO2 95%   BMI 30.90 kg/m   Physical Exam  Constitutional: She is oriented to person, place, and time. She appears well-developed and well-nourished. No distress.  HENT:  Head: Normocephalic and atraumatic.  Eyes: EOM are normal.  Neck: Normal range of motion.  Cardiovascular: Normal rate, regular rhythm and normal heart sounds.  Pulmonary/Chest: Effort normal and breath sounds normal. No stridor. She has no wheezes. She has no rales.  Abdominal: Soft. She exhibits no distension. There is no tenderness.  Musculoskeletal: Normal range of motion.  Neurological: She is alert and oriented to person, place, and time.  Skin: Skin is warm and dry.  Psychiatric: She has a normal mood and affect. Judgment normal.  Nursing note and vitals reviewed.    ED Treatments / Results  Labs (all labs ordered are listed, but only abnormal results are displayed) Labs Reviewed  COMPREHENSIVE METABOLIC PANEL - Abnormal; Notable for the following components:      Result Value   Chloride 100 (*)    Glucose, Bld 105 (*)    Calcium 8.7 (*)    Albumin 3.4 (*)    All other components within normal limits  CBC WITH DIFFERENTIAL/PLATELET  I-STAT CG4 LACTIC ACID, ED    EKG None  Radiology Dg Chest 2 View  Result Date: 10/10/2017 CLINICAL DATA:  Cough and shortness of  breath for several weeks with fevers EXAM: CHEST - 2 VIEW COMPARISON:  03/14/2015 FINDINGS: The heart size and mediastinal contours are within normal limits. Both lungs are clear. The visualized skeletal structures are unremarkable. IMPRESSION: No active cardiopulmonary disease. Electronically Signed   By: Inez Catalina M.D.   On: 10/10/2017 21:40    Procedures Procedures (including critical care time)  Medications Ordered in ED Medications  acetaminophen (TYLENOL) tablet  650 mg (650 mg Oral Given 10/10/17 2050)  albuterol (PROVENTIL) (2.5 MG/3ML) 0.083% nebulizer solution 5 mg (5 mg Nebulization Given 10/10/17 2050)  albuterol (PROVENTIL) (2.5 MG/3ML) 0.083% nebulizer solution 5 mg (5 mg Nebulization Given 10/11/17 0210)  ipratropium (ATROVENT) nebulizer solution 0.5 mg (0.5 mg Nebulization Given 10/11/17 0210)     Initial Impression / Assessment and Plan / ED Course  I have reviewed the triage vital signs and the nursing notes.  Pertinent labs & imaging results that were available during my care of the patient were reviewed by me and considered in my medical decision making (see chart for details).     Patient feels better after bronchodilators.  This is likely bronchospasm and associated bronchitis.  Chest x-ray without pneumonia.  Patient be discharged home with the bronchodilator as well as primary care follow-up.  I will place her on a short course of doxycycline as well.  Doubt pulmonary embolism.  Doubt congestive heart failure.  Doubt atypical presentation of ACS.  I personally reviewed the patient's prior medical records  I personally reviewed the patient's chest x-ray which demonstrates no obvious acute cardiopulmonary abnormality  Final Clinical Impressions(s) / ED Diagnoses   Final diagnoses:  Acute bronchitis, unspecified organism    ED Discharge Orders        Ordered    doxycycline (VIBRAMYCIN) 100 MG capsule  2 times daily     10/11/17 0508    albuterol (PROVENTIL  HFA;VENTOLIN HFA) 108 (90 Base) MCG/ACT inhaler  Every 4 hours PRN     10/11/17 0508       Jola Schmidt, MD 10/11/17 [phone]

## 2017-11-11 DIAGNOSIS — L4050 Arthropathic psoriasis, unspecified: Secondary | ICD-10-CM | POA: Diagnosis not present

## 2017-12-03 ENCOUNTER — Other Ambulatory Visit: Payer: Self-pay | Admitting: Physician Assistant

## 2017-12-03 DIAGNOSIS — M7989 Other specified soft tissue disorders: Secondary | ICD-10-CM

## 2017-12-03 DIAGNOSIS — M150 Primary generalized (osteo)arthritis: Secondary | ICD-10-CM | POA: Diagnosis not present

## 2017-12-03 DIAGNOSIS — Z79899 Other long term (current) drug therapy: Secondary | ICD-10-CM | POA: Diagnosis not present

## 2017-12-03 DIAGNOSIS — L409 Psoriasis, unspecified: Secondary | ICD-10-CM | POA: Diagnosis not present

## 2017-12-03 DIAGNOSIS — L4050 Arthropathic psoriasis, unspecified: Secondary | ICD-10-CM | POA: Diagnosis not present

## 2017-12-03 DIAGNOSIS — E669 Obesity, unspecified: Secondary | ICD-10-CM | POA: Diagnosis not present

## 2017-12-03 DIAGNOSIS — M79605 Pain in left leg: Secondary | ICD-10-CM | POA: Diagnosis not present

## 2017-12-03 DIAGNOSIS — K219 Gastro-esophageal reflux disease without esophagitis: Secondary | ICD-10-CM | POA: Diagnosis not present

## 2017-12-03 DIAGNOSIS — Z6834 Body mass index (BMI) 34.0-34.9, adult: Secondary | ICD-10-CM | POA: Diagnosis not present

## 2017-12-10 ENCOUNTER — Other Ambulatory Visit: Payer: Self-pay

## 2017-12-10 ENCOUNTER — Ambulatory Visit
Admission: RE | Admit: 2017-12-10 | Discharge: 2017-12-10 | Disposition: A | Discharge status: Home / Self Care | Payer: Medicare Other | Source: Ambulatory Visit | Attending: Physician Assistant | Admitting: Physician Assistant

## 2017-12-10 ENCOUNTER — Encounter (HOSPITAL_COMMUNITY): Payer: Self-pay

## 2017-12-10 ENCOUNTER — Emergency Department (HOSPITAL_COMMUNITY)
Admission: EM | Admit: 2017-12-10 | Discharge: 2017-12-10 | Disposition: A | Discharge status: Home / Self Care | Payer: Medicare Other | Attending: Emergency Medicine | Admitting: Emergency Medicine

## 2017-12-10 DIAGNOSIS — Z79899 Other long term (current) drug therapy: Secondary | ICD-10-CM | POA: Diagnosis not present

## 2017-12-10 DIAGNOSIS — I82412 Acute embolism and thrombosis of left femoral vein: Secondary | ICD-10-CM | POA: Diagnosis not present

## 2017-12-10 DIAGNOSIS — M7989 Other specified soft tissue disorders: Secondary | ICD-10-CM

## 2017-12-10 DIAGNOSIS — I82432 Acute embolism and thrombosis of left popliteal vein: Secondary | ICD-10-CM

## 2017-12-10 DIAGNOSIS — R2242 Localized swelling, mass and lump, left lower limb: Secondary | ICD-10-CM | POA: Diagnosis present

## 2017-12-10 LAB — I-STAT TROPONIN, ED: TROPONIN I, POC: 0 ng/mL (ref 0.00–0.08)

## 2017-12-10 LAB — BASIC METABOLIC PANEL
Anion gap: 9 (ref 5–15)
BUN: 15 mg/dL (ref 6–20)
CALCIUM: 9.1 mg/dL (ref 8.9–10.3)
CO2: 29 mmol/L (ref 22–32)
CREATININE: 0.8 mg/dL (ref 0.44–1.00)
Chloride: 102 mmol/L (ref 101–111)
Glucose, Bld: 107 mg/dL — ABNORMAL HIGH (ref 65–99)
Potassium: 4.1 mmol/L (ref 3.5–5.1)
Sodium: 140 mmol/L (ref 135–145)

## 2017-12-10 LAB — CBC
HCT: 41.4 % (ref 36.0–46.0)
Hemoglobin: 12.6 g/dL (ref 12.0–15.0)
MCH: 30 pg (ref 26.0–34.0)
MCHC: 30.4 g/dL (ref 30.0–36.0)
MCV: 98.6 fL (ref 78.0–100.0)
PLATELETS: 275 10*3/uL (ref 150–400)
RBC: 4.2 MIL/uL (ref 3.87–5.11)
RDW: 13.5 % (ref 11.5–15.5)
WBC: 4.9 10*3/uL (ref 4.0–10.5)

## 2017-12-10 MED ORDER — RIVAROXABAN 15 MG PO TABS
15.0000 mg | ORAL_TABLET | Freq: Once | ORAL | Status: AC
  Administered 2017-12-10: 15 mg via ORAL
  Filled 2017-12-10 (×2): qty 1

## 2017-12-10 MED ORDER — RIVAROXABAN (XARELTO) VTE STARTER PACK (15 & 20 MG): ORAL_TABLET | ORAL | 0 refills | Status: DC

## 2017-12-10 NOTE — ED Provider Notes (Signed)
[facility] EMERGENCY DEPARTMENT Provider Note   CSN: [id number] Arrival date & time: 12/10/17  1626     History   Chief Complaint Chief Complaint  Patient presents with  . DVT  . Leg Swelling    HPI Phyllis Austin is a 72 y.o. female who presents emergency department today for left leg swelling x5 weeks with a confirmed DVT on ultrasound.  Patient reports that she started noticing swelling in her left leg approximately 5 weeks ago.  She went to her rheumatoid doctor who sent her for an ultrasound.  The ultrasound was performed today and was confirmed to be positive for a DVT in the femoral and popliteal vein.  She was sent over to start anticoagulation medicine.  She reports she has had prior blood clot in the right leg.  She denies any anticoagulation use currently.  She denies any chest pain, shortness of breath, cough or hemoptysis.  She reports she is able to walk long distances and exercise without becoming short of breath.  HPI  Past Medical History:  Diagnosis Date  . Allergy   . Cataract    surgery to remove  . Diverticulosis   . Gallstones   . GERD (gastroesophageal reflux disease)   . Glaucoma    no med - just watching  . Hepatitis    as a child  . Hiatal hernia   . Osteoarthritis   . Psoriatic arthritis ([place])   . RA (rheumatoid arthritis) [facility])     Patient Active Problem List   Diagnosis Date Noted  . Left leg pain 07/23/2016  . Right ureteral stone 05/20/2016  . Abdominal pain, epigastric 03/23/2015  . Cholelithiasis 03/23/2015  . Hiatal hernia 03/23/2015    Past Surgical History:  Procedure Laterality Date  . BUNIONECTOMY Right    with correction  . CESAREAN SECTION  10/21/79, 03/28/81   x 2  . CHOLECYSTECTOMY N/A 04/12/2015   Procedure: LAPAROSCOPIC CHOLECYSTECTOMY;  Surgeon: [name], MD;  Location: [facility];  Service: General;  Laterality: N/A;  . COLONOSCOPY  4/20006   Sharlett Iles  . DILATION AND CURETTAGE OF UTERUS      . EYE SURGERY Bilateral    cataract surgery w/ lens implant  . INTRAOCULAR LENS INSERTION Bilateral   . TIBIA FRACTURE SURGERY Right 1986   Bone transplant  . WISDOM TOOTH EXTRACTION       OB History   None      Home Medications    Prior to Admission medications   Medication Sig Start Date End Date Taking? Authorizing Provider  acetaminophen (TYLENOL) 650 MG CR tablet Take 1,300 mg by mouth every 8 (eight) hours as needed for pain.   Yes [provider]  albuterol (PROVENTIL HFA;VENTOLIN HFA) 108 (90 Base) MCG/ACT inhaler Inhale 2 puffs into the lungs every 4 (four) hours as needed for wheezing or shortness of breath. 10/11/17  Yes Jola Schmidt, MD  Biotin 5 MG TABS Take 1 tablet by mouth at bedtime.   Yes [provider]  calcium acetate (PHOSLO) 667 MG capsule Take 667 mg by mouth at bedtime.    Yes [provider]  Calcium Polycarbophil (FIBER) 625 MG TABS Take 1 tablet by mouth at bedtime.    Yes [provider]  Doxylamine Succinate, Sleep, (SLEEP AID PO) Take 1 tablet by mouth at bedtime.    Yes [provider]  InFLIXimab (REMICADE IV) Inject into the vein See admin instructions. Every 8 weeks at Dr. [name]  office   Yes [provider]  meloxicam (MOBIC) 15 MG tablet Take 15 mg by mouth at bedtime.    Yes [provider]  pantoprazole (PROTONIX) 20 MG tablet Take 1 tablet (20 mg total) by mouth daily. Patient taking differently: Take 20 mg by mouth at bedtime.  03/14/15  Yes Rancour, Annie Main, MD  Polyethyl Glycol-Propyl Glycol (SYSTANE OP) Place 1 drop into both eyes daily as needed (dry eyes).   Yes [provider]  traMADol (ULTRAM) 50 MG tablet Take 50 mg by mouth at bedtime.    Yes [provider]  doxycycline (VIBRAMYCIN) 100 MG capsule Take 1 capsule (100 mg total) by mouth 2 (two) times daily. Patient not taking: Reported on 12/10/2017 10/11/17   Jola Schmidt, MD    Family  History Family History  Problem Relation Age of Onset  . Diabetes Mother   . Heart attack Mother   . Transient ischemic attack Mother   . Diabetes Brother   . Diabetes Maternal Grandmother   . Colon cancer Neg Hx   . Esophageal cancer Neg Hx   . Stomach cancer Neg Hx     Social History Social History   Tobacco Use  . Smoking status: Never Smoker  . Smokeless tobacco: Never Used  Substance Use Topics  . Alcohol use: No    Alcohol/week: 0.0 oz  . Drug use: No     Allergies   Patient has no known allergies.   Review of Systems Review of Systems  All other systems reviewed and are negative.    Physical Exam Updated Vital Signs BP (!) 159/80 (BP Location: Right Arm)   Pulse 89   Temp 98.7 F (37.1 C) (Oral)   Resp 16   Ht 5\' 4"  (1.626 m)   Wt 86.2 kg (190 lb)   SpO2 100%   BMI 32.61 kg/m   Physical Exam  Constitutional: She appears well-developed and well-nourished.  HENT:  Head: Normocephalic and atraumatic.  Right Ear: External ear normal.  Left Ear: External ear normal.  Nose: Nose normal.  Mouth/Throat: Uvula is midline, oropharynx is clear and moist and mucous membranes are normal. No tonsillar exudate.  Eyes: Pupils are equal, round, and reactive to light. Right eye exhibits no discharge. Left eye exhibits no discharge. No scleral icterus.  Neck: Trachea normal. Neck supple. No spinous process tenderness present. No neck rigidity. Normal range of motion present.  Cardiovascular: Normal rate, regular rhythm and intact distal pulses.  No murmur heard. Pulses:      Radial pulses are 2+ on the right side, and 2+ on the left side.       Dorsalis pedis pulses are 2+ on the right side, and 2+ on the left side.       Posterior tibial pulses are 2+ on the right side, and 2+ on the left side.  Patient with left lower leg swelling with left calf greater in circumference when compared to the right. No skin changes. No pitting edema. Positive homans' sign on the  left.   Pulmonary/Chest: Effort normal and breath sounds normal. She exhibits no tenderness.  Patient satting at 100% on room air with good waveform on monitor. No increased work of breathing. No accessory muscle use. Patient is sitting upright, speaking in full sentences without difficulty   Abdominal: Soft. Bowel sounds are normal. There is no tenderness. There is no rebound and no guarding.  Musculoskeletal: She exhibits no edema.  Lymphadenopathy:    She has no  cervical adenopathy.  Neurological: She is alert. She has normal strength. No sensory deficit.  Skin: Skin is warm and dry. No rash noted. She is not diaphoretic.  Psychiatric: She has a normal mood and affect.  Nursing note and vitals reviewed.    ED Treatments / Results  Labs (all labs ordered are listed, but only abnormal results are displayed) Labs Reviewed  BASIC METABOLIC PANEL - Abnormal; Notable for the following components:      Result Value   Glucose, Bld 107 (*)    All other components within normal limits  CBC  I-STAT TROPONIN, ED    EKG None  Radiology US Venous Img Lower Unilateral Left  Result Date: 12/10/2017 CLINICAL DATA:  Left lower extremity pain and edema for the past 5 weeks. History of prior DVT. Evaluate for acute or chronic DVT. EXAM: LEFT LOWER EXTREMITY VENOUS DOPPLER ULTRASOUND TECHNIQUE: Gray-scale sonography with graded compression, as well as color Doppler and duplex ultrasound were performed to evaluate the lower extremity deep venous systems from the level of the common femoral vein and including the common femoral, femoral, profunda femoral, popliteal and calf veins including the posterior tibial, peroneal and gastrocnemius veins when visible. The superficial great saphenous vein was also interrogated. Spectral Doppler was utilized to evaluate flow at rest and with distal augmentation maneuvers in the common femoral, femoral and popliteal veins. COMPARISON:  None. FINDINGS: Contralateral  Common Femoral Vein: Respiratory phasicity is normal and symmetric with the symptomatic side. No evidence of thrombus. Normal compressibility. Common Femoral Vein: No evidence of thrombus. Normal compressibility, respiratory phasicity and response to augmentation. Saphenofemoral Junction: No evidence of thrombus. Normal compressibility and flow on color Doppler imaging. Profunda Femoral Vein: No evidence of thrombus. Normal compressibility and flow on color Doppler imaging. Femoral Vein: There is mixed echogenic occlusive DVT within the proximal (image 26) mid (image 28) and distal (image 30) aspects of the left femoral vein. Popliteal Vein: There is hypoechoic occlusive DVT within the left popliteal vein (image 33). Calf Veins: Not imaged Superficial Great Saphenous Vein: No evidence of thrombus. Normal compressibility. Venous Reflux:  None. Other Findings:  None. IMPRESSION: Examination is positive for age-indeterminate occlusive DVT involving the left femoral and popliteal veins. In the absence of prior examinations, an acute on chronic process is not excluded. Clinical correlation is advised. Electronically Signed   By: Sandi Mariscal M.D.   On: 12/10/2017 15:58    Procedures Procedures (including critical care time)  Medications Ordered in ED Medications  Rivaroxaban (XARELTO) tablet 15 mg (15 mg Oral Given 12/10/17 2114)     Initial Impression / Assessment and Plan / ED Course  I have reviewed the triage vital signs and the nursing notes.  Pertinent labs & imaging results that were available during my care of the patient were reviewed by me and considered in my medical decision making (see chart for details).     72 y.o. female with outpatient ultrasound showing DVT in the femoral and popliteal veins of the left lower leg. Patient is not hypoxic or tachycardic. She denies CP or SOB. She notes she is able to ambulate for long distances without becoming short or breath. Do not feel she needs to be  evaluated for PE at this time.  Patient vital signs are reassuring.  EKG and Lab work reviewed and reassuring.  Patient with normal kidney function.  Will start on Xarelto.  Case management has seen the patient in given starter pack coupon.  Pharmacist has  seen the patient and educated patient on medication. First dose given in the department. Patient reports PCP on file she no longer follows with.  Will give referral to heart care for follow-up after discussing with pharmacist.  Also given information on [facility] and have patient use number on discharge instructions to find PCP.  Informed that she needs to follow-up for further evaluation and future prescriptions.  Specific return precautions discussed. Time was given for all questions to be answered. The patient verbalized understanding and agreement with plan. The patient appears safe for discharge home.  Patient case seen and discussed with Dr. Alvino Chapel who is in agreement with plan.   Final Clinical Impressions(s) / ED Diagnoses   Final diagnoses:  Acute deep vein thrombosis (DVT) of popliteal vein of left lower extremity [facility])    ED Discharge Orders        Ordered    Rivaroxaban 15 & 20 MG TBPK     12/10/17 2119       Jillyn Ledger, PA-C 12/11/17 3009    Davonna Belling, MD 12/12/17 1513

## 2017-12-10 NOTE — ED Provider Notes (Addendum)
Patient placed in Quick Look pathway, seen and evaluated   Chief Complaint: DVT  HPI:   72 year old female presents with L leg swelling and confirmed DVT on [place]. She's had swelling in her L leg for several weeks. She went to her RA doctor who sent her for an [place] which confirmed DVT in the femoral and popliteal vein. She was sent here to start anticoagulation and to evaluate for PE. She denies chest pain or SOB  ROS: +Leg swelling  Physical Exam:   Gen: No distress  Neuro: Awake and Alert  Skin: Warm    Focused Exam: Heart: Regular rate and rhythm    Lungs: CTA    L lower extremity: Significant swelling and skin changes compared to R   Initiation of care has begun. The patient has been counseled on the process, plan, and necessity for staying for the completion/evaluation, and the remainder of the medical screening examination    Recardo Evangelist, PA-C 12/10/17 1720    Recardo Evangelist, PA-C 12/10/17 Geraldine Solar, MD 12/13/17 [phone]

## 2017-12-10 NOTE — Discharge Instructions (Addendum)
Information on my medicine - XARELTO (rivaroxaban)  This medication education was reviewed with me or my healthcare representative as part of my discharge preparation.   WHY WAS XARELTO PRESCRIBED FOR YOU? Xarelto was prescribed to treat blood clots that may have been found in the veins of your legs (deep vein thrombosis) or in your lungs (pulmonary embolism) and to reduce the risk of them occurring again.  What do you need to know about Xarelto? The starting dose is one 15 mg tablet taken TWICE daily with food for the FIRST 21 DAYS then on June 11th  the dose is changed to one 20 mg tablet taken ONCE A DAY with your evening meal.  DO NOT stop taking Xarelto without talking to the health care provider who prescribed the medication.  Refill your prescription for 20 mg tablets before you run out.  After discharge, you should have regular check-up appointments with your healthcare provider that is prescribing your Xarelto.  In the future your dose may need to be changed if your kidney function changes by a significant amount.  What do you do if you miss a dose? If you are taking Xarelto TWICE DAILY and you miss a dose, take it as soon as you remember. You may take two 15 mg tablets (total 30 mg) at the same time then resume your regularly scheduled 15 mg twice daily the next day.  If you are taking Xarelto ONCE DAILY and you miss a dose, take it as soon as you remember on the same day then continue your regularly scheduled once daily regimen the next day. Do not take two doses of Xarelto at the same time.   Important Safety Information Xarelto is a blood thinner medicine that can cause bleeding. You should call your healthcare provider right away if you experience any of the following: ? Bleeding from an injury or your nose that does not stop. ? Unusual colored urine (red or dark brown) or unusual colored stools (red or black). ? Unusual bruising for unknown reasons. ? A serious fall  or if you hit your head (even if there is no bleeding).  Some medicines may interact with Xarelto and might increase your risk of bleeding while on Xarelto. To help avoid this, consult your healthcare provider or pharmacist prior to using any new prescription or non-prescription medications, including herbals, vitamins, non-steroidal anti-inflammatory drugs (NSAIDs) and supplements.  This website has more information on Xarelto: [email or web address].[email or web address]

## 2017-12-10 NOTE — ED Notes (Signed)
Pt ambulatory to room with steady gait, no assistance, no SOB with exertion.

## 2017-12-10 NOTE — ED Triage Notes (Addendum)
Pt endorses being sent by [facility] and Rheumatologist for positive DVT in left leg. Rheumatology PA called this Rn and gave report and sent here to start treatment. Pt denies SHOB. VSS.

## 2017-12-10 NOTE — ED Notes (Signed)
Pharmacist in room.

## 2017-12-15 ENCOUNTER — Ambulatory Visit (INDEPENDENT_AMBULATORY_CARE_PROVIDER_SITE_OTHER): Payer: Medicare Other | Admitting: Cardiovascular Disease

## 2017-12-15 ENCOUNTER — Encounter: Payer: Self-pay | Admitting: Cardiovascular Disease

## 2017-12-15 VITALS — BP 130/68 | HR 88 | Ht 63.0 in | Wt 192.0 lb

## 2017-12-15 DIAGNOSIS — I82412 Acute embolism and thrombosis of left femoral vein: Secondary | ICD-10-CM

## 2017-12-15 DIAGNOSIS — I872 Venous insufficiency (chronic) (peripheral): Secondary | ICD-10-CM | POA: Diagnosis not present

## 2017-12-15 DIAGNOSIS — M069 Rheumatoid arthritis, unspecified: Secondary | ICD-10-CM | POA: Diagnosis not present

## 2017-12-15 MED ORDER — RIVAROXABAN 20 MG PO TABS: 20.0000 mg | ORAL_TABLET | Freq: Every day | ORAL | 5 refills | Status: DC

## 2017-12-15 NOTE — Progress Notes (Signed)
Cardiology Office Note:    Date:  12/16/2017   ID:  Phyllis Austin, DOB 01-Dec-1945, MRN [id number]  PCP:  Sanjuana Kava, MD  Cardiologist:  No primary care provider on file.   Referring MD: Sanjuana Kava, MD   Chief Complaint  Patient presents with  . Follow-up    DVT    History of Present Illness:    Phyllis Austin is a 72 y.o. female with a hx of Mrs. Phyllis Austin was recently diagnosed with a DVT of the left lower extremity (common femoral-popliteal) and is here to establish follow-up.  She has had problems with superficial varicose veins since adolescence, but this is her first ever episode of DVT.  Symptoms may have started about 7 weeks before the diagnosis actually made.  She had a bad flu/lung infection from which she recovered slowly.  When the ultrasound was performed on June 12 the clot had some appearance of chronicity already.  She was started on Xarelto and has been tolerating it well without any bleeding complications.  She denies any issues of dyspnea, cough or hemoptysis and has not had any pleuritic chest rest or with activity.  She is a fairly independent and active individual, but she lives with her son and her son's family.  Her past medical history significant for rheumatoid arthritis and she receives periodic Remicade infusions and Dr. [name] office.  She has never been told that she has lupus anticoagulant or other procoagulant conditions.  She does know that her mother had a DVT.  Past Medical History:  Diagnosis Date  . Allergy   . Cataract    surgery to remove  . Diverticulosis   . Gallstones   . GERD (gastroesophageal reflux disease)   . Glaucoma    no med - just watching  . Hepatitis    as a child  . Hiatal hernia   . Osteoarthritis   . Psoriatic arthritis ([place])   . RA (rheumatoid arthritis) ([place])     Past Surgical History:  Procedure Laterality Date  . BUNIONECTOMY Right    with correction  . CESAREAN SECTION  10/21/79, 03/28/81   x 2  .  CHOLECYSTECTOMY N/A 04/12/2015   Procedure: LAPAROSCOPIC CHOLECYSTECTOMY;  Surgeon: [name], MD;  Location: [place];  Service: General;  Laterality: N/A;  . COLONOSCOPY  4/20006   Sharlett Iles  . DILATION AND CURETTAGE OF UTERUS    . EYE SURGERY Bilateral    cataract surgery w/ lens implant  . INTRAOCULAR LENS INSERTION Bilateral   . TIBIA FRACTURE SURGERY Right 1986   Bone transplant  . WISDOM TOOTH EXTRACTION      Current Medications: Current Meds  Medication Sig  . acetaminophen (TYLENOL) 650 MG CR tablet Take 1,300 mg by mouth every 8 (eight) hours as needed for pain.  Marland Kitchen albuterol (PROVENTIL HFA;VENTOLIN HFA) 108 (90 Base) MCG/ACT inhaler Inhale 2 puffs into the lungs every 4 (four) hours as needed for wheezing or shortness of breath.  . Biotin 5 MG TABS Take 1 tablet by mouth at bedtime.  . calcium acetate (PHOSLO) 667 MG capsule Take 667 mg by mouth at bedtime.   . Calcium Polycarbophil (FIBER) 625 MG TABS Take 1 tablet by mouth at bedtime.   Marland Kitchen doxycycline (VIBRAMYCIN) 100 MG capsule Take 1 capsule (100 mg total) by mouth 2 (two) times daily.  . Doxylamine Succinate, Sleep, (SLEEP AID PO) Take 1 tablet by mouth at bedtime.   . InFLIXimab (REMICADE IV) Inject into the vein See  admin instructions. Every 8 weeks at Dr. [name] office  . meloxicam (MOBIC) 15 MG tablet Take 15 mg by mouth at bedtime.   . pantoprazole (PROTONIX) 20 MG tablet Take 1 tablet (20 mg total) by mouth daily. (Patient taking differently: Take 20 mg by mouth at bedtime. )  . Polyethyl Glycol-Propyl Glycol (SYSTANE OP) Place 1 drop into both eyes daily as needed (dry eyes).  . traMADol (ULTRAM) 50 MG tablet Take 50 mg by mouth at bedtime.   . [DISCONTINUED] Rivaroxaban 15 & 20 MG TBPK Take as directed on package: Start with one 15mg  tablet by mouth twice a day with food. On Day 22, switch to one 20mg  tablet once a day with food.     Allergies:   Patient has no known allergies.   Social History    Socioeconomic History  . Marital status: Married    Spouse name: Not on file  . Number of children: 2  . Years of education: Not on file  . Highest education level: Not on file  Occupational History  . Occupation: retired   Scientific laboratory technician  . Financial resource strain: Not on file  . Food insecurity:    Worry: Not on file    Inability: Not on file  . Transportation needs:    Medical: Not on file    Non-medical: Not on file  Tobacco Use  . Smoking status: Never Smoker  . Smokeless tobacco: Never Used  Substance and Sexual Activity  . Alcohol use: No    Alcohol/week: 0.0 oz  . Drug use: No  . Sexual activity: Not on file  Lifestyle  . Physical activity:    Days per week: Not on file    Minutes per session: Not on file  . Stress: Not on file  Relationships  . Social connections:    Talks on phone: Not on file    Gets together: Not on file    Attends religious service: Not on file    Active member of club or organization: Not on file    Attends meetings of clubs or organizations: Not on file    Relationship status: Not on file  Other Topics Concern  . Not on file  Social History Narrative  . Not on file     Family History: The patient's family history includes Diabetes in her brother, maternal grandmother, and mother; Heart attack in her mother; Transient ischemic attack in her mother. There is no history of Colon cancer, Esophageal cancer, or Stomach cancer.  ROS:   Please see the history of present illness.     All other systems reviewed and are negative.  EKGs/Labs/Other Studies Reviewed:    The following studies were reviewed today: Venous duplex ultrasound June 19  EKG:  EKG is ordered today.  The ekg ordered today demonstrates normal sinus rhythm with mild delayed anterior R wave progression, otherwise normal, QTC 428 ms  Recent Labs: 10/10/2017: ALT 15 12/10/2017: BUN 15; Creatinine, Ser 0.80; Hemoglobin 12.6; Platelets 275; Potassium 4.1; Sodium 140   Recent Lipid Panel No results found for: CHOL, TRIG, HDL, CHOLHDL, VLDL, LDLCALC, LDLDIRECT  Physical Exam:    VS:  BP 130/68 (BP Location: Left Arm, Patient Position: Sitting, Cuff Size: Normal)   Pulse 88   Ht 5\' 3"  (1.6 m)   Wt 192 lb (87.1 kg)   BMI 34.01 kg/m     Wt Readings from Last 3 Encounters:  12/15/17 192 lb (87.1 kg)  12/10/17 190 lb (86.2 kg)  10/10/17 180 lb (81.6 kg)     GEN: Obese, well nourished, well developed in no acute distress HEENT: Normal NECK: No JVD; No carotid bruits LYMPHATICS: No lymphadenopathy CARDIAC: RRR, no murmurs, rubs, gallops RESPIRATORY:  Clear to auscultation without rales, wheezing or rhonchi  ABDOMEN: Soft, non-tender, non-distended MUSCULOSKELETAL: The left lower extremity is clearly larger in girth compared with the right, demonstrates slight cyanosis, but does not have pitting edema.  Distal pulses are excellent. No deformity  SKIN: Warm and dry, prominent bilateral superficial varicose veins NEUROLOGIC:  Alert and oriented x 3 PSYCHIATRIC:  Normal affect   ASSESSMENT:    1. Acute deep vein thrombosis (DVT) of femoral vein of left lower extremity (HCC)   2. Peripheral venous insufficiency   3. Rheumatoid arthritis, involving unspecified site, unspecified rheumatoid factor presence ([place])    PLAN:    In order of problems listed above:  1. DVT: She is on appropriate anticoagulant therapy with Xarelto which should be continued for obesity.  I am not sure whether we should categorize this is a provoked DVT.  She did have an acute respiratory illness but was not really immobilized and she was not hospitalized.  There might be a family pattern of thrombophilia.  We will for check hypercoagulable state.  Unfortunately, she continues to have some asymmetrical swelling and a clot appears to have some features suggesting organized.  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  She should keep the leg elevated as much as  possible and compression stockings will be beneficial. 2. Peripheral venous insufficiency: Predates the DVT by many years and is likely an imperative trait.  Again compression stockings would be beneficial.  Weight loss would also positive. 3. RA: Takes a daily nonsteroidal anti-inflammatory drug which would come with increased risk of GI bleeding while on Xarelto.  I asked her to discuss alternative options with Dr. Amil Amen at her upcoming appointment.  In the meantime I would advise using anti-inflammatory sparingly, preferring the use of acetaminophen   Medication Adjustments/Labs and Tests Ordered: Current medicines are reviewed at length with the patient today.  Concerns regarding medicines are outlined above.  Orders Placed This Encounter  Procedures  . Inherited Thrombophilia (COHESION)   Meds ordered this encounter  Medications  . rivaroxaban (XARELTO) 20 MG TABS tablet    Sig: Take 1 tablet (20 mg total) by mouth daily with supper.    Dispense:  30 tablet    Refill:  5    Patient Instructions  Medication Instructions: Dr Sallyanne Kuster recommends that you continue on your current medications as directed. Please refer to the Current Medication list given to you today.  Labwork: Your physician recommends that you return for lab work in [place].  Testing/Procedures: NONE ORDERED  Follow-up: Dr Sallyanne Kuster recommends that you schedule a follow-up appointment in 6 months. You will receive a reminder letter in the mail two months in advance. If you don't receive a letter, please call our office to schedule the follow-up appointment.  If you need a refill on your cardiac medications before your next appointment, please call your pharmacy.    Signed, Sanda Klein, MD  12/16/2017 3:34 PM    [place]

## 2017-12-15 NOTE — Patient Instructions (Signed)
Medication Instructions: Dr Sallyanne Kuster recommends that you continue on your current medications as directed. Please refer to the Current Medication list given to you today.  Labwork: Your physician recommends that you return for lab work in [place].  Testing/Procedures: NONE ORDERED  Follow-up: Dr Sallyanne Kuster recommends that you schedule a follow-up appointment in 6 months. You will receive a reminder letter in the mail two months in advance. If you don't receive a letter, please call our office to schedule the follow-up appointment.  If you need a refill on your cardiac medications before your next appointment, please call your pharmacy.

## 2017-12-16 ENCOUNTER — Encounter: Payer: Self-pay | Admitting: Cardiovascular Disease

## 2018-01-06 DIAGNOSIS — L4050 Arthropathic psoriasis, unspecified: Secondary | ICD-10-CM | POA: Diagnosis not present

## 2018-01-22 ENCOUNTER — Other Ambulatory Visit: Payer: Self-pay

## 2018-03-03 DIAGNOSIS — M150 Primary generalized (osteo)arthritis: Secondary | ICD-10-CM | POA: Diagnosis not present

## 2018-03-03 DIAGNOSIS — L4050 Arthropathic psoriasis, unspecified: Secondary | ICD-10-CM | POA: Diagnosis not present

## 2018-03-03 DIAGNOSIS — Z79899 Other long term (current) drug therapy: Secondary | ICD-10-CM | POA: Diagnosis not present

## 2018-04-07 DIAGNOSIS — L821 Other seborrheic keratosis: Secondary | ICD-10-CM | POA: Diagnosis not present

## 2018-04-07 DIAGNOSIS — L570 Actinic keratosis: Secondary | ICD-10-CM | POA: Diagnosis not present

## 2018-04-07 DIAGNOSIS — L814 Other melanin hyperpigmentation: Secondary | ICD-10-CM | POA: Diagnosis not present

## 2018-04-28 DIAGNOSIS — L4050 Arthropathic psoriasis, unspecified: Secondary | ICD-10-CM | POA: Diagnosis not present

## 2018-05-11 DIAGNOSIS — Z1231 Encounter for screening mammogram for malignant neoplasm of breast: Secondary | ICD-10-CM | POA: Diagnosis not present

## 2018-05-11 DIAGNOSIS — Z6832 Body mass index (BMI) 32.0-32.9, adult: Secondary | ICD-10-CM | POA: Diagnosis not present

## 2018-05-11 DIAGNOSIS — Z124 Encounter for screening for malignant neoplasm of cervix: Secondary | ICD-10-CM | POA: Diagnosis not present

## 2018-05-19 DIAGNOSIS — Z23 Encounter for immunization: Secondary | ICD-10-CM | POA: Diagnosis not present

## 2018-06-04 DIAGNOSIS — M7989 Other specified soft tissue disorders: Secondary | ICD-10-CM | POA: Diagnosis not present

## 2018-06-04 DIAGNOSIS — Z79899 Other long term (current) drug therapy: Secondary | ICD-10-CM | POA: Diagnosis not present

## 2018-06-04 DIAGNOSIS — M150 Primary generalized (osteo)arthritis: Secondary | ICD-10-CM | POA: Diagnosis not present

## 2018-06-04 DIAGNOSIS — Z6834 Body mass index (BMI) 34.0-34.9, adult: Secondary | ICD-10-CM | POA: Diagnosis not present

## 2018-06-04 DIAGNOSIS — L409 Psoriasis, unspecified: Secondary | ICD-10-CM | POA: Diagnosis not present

## 2018-06-04 DIAGNOSIS — L4050 Arthropathic psoriasis, unspecified: Secondary | ICD-10-CM | POA: Diagnosis not present

## 2018-06-04 DIAGNOSIS — E669 Obesity, unspecified: Secondary | ICD-10-CM | POA: Diagnosis not present

## 2018-06-04 DIAGNOSIS — K219 Gastro-esophageal reflux disease without esophagitis: Secondary | ICD-10-CM | POA: Diagnosis not present

## 2018-06-23 DIAGNOSIS — L4050 Arthropathic psoriasis, unspecified: Secondary | ICD-10-CM | POA: Diagnosis not present

## 2018-06-23 DIAGNOSIS — Z79899 Other long term (current) drug therapy: Secondary | ICD-10-CM | POA: Diagnosis not present

## 2018-06-30 ENCOUNTER — Other Ambulatory Visit: Payer: Self-pay | Admitting: Cardiovascular Disease

## 2018-07-02 ENCOUNTER — Encounter (INDEPENDENT_AMBULATORY_CARE_PROVIDER_SITE_OTHER): Payer: Self-pay

## 2018-07-02 ENCOUNTER — Encounter: Payer: Self-pay | Admitting: Cardiovascular Disease

## 2018-07-02 ENCOUNTER — Ambulatory Visit (INDEPENDENT_AMBULATORY_CARE_PROVIDER_SITE_OTHER): Payer: Medicare Other | Admitting: Cardiovascular Disease

## 2018-07-02 VITALS — BP 120/80 | HR 78 | Ht 63.5 in | Wt 200.0 lb

## 2018-07-02 DIAGNOSIS — K9089 Other intestinal malabsorption: Secondary | ICD-10-CM

## 2018-07-02 DIAGNOSIS — Z86718 Personal history of other venous thrombosis and embolism: Secondary | ICD-10-CM

## 2018-07-02 DIAGNOSIS — I872 Venous insufficiency (chronic) (peripheral): Secondary | ICD-10-CM | POA: Diagnosis not present

## 2018-07-02 DIAGNOSIS — Z7901 Long term (current) use of anticoagulants: Secondary | ICD-10-CM

## 2018-07-02 DIAGNOSIS — M069 Rheumatoid arthritis, unspecified: Secondary | ICD-10-CM | POA: Diagnosis not present

## 2018-07-02 MED ORDER — CHOLESTYRAMINE 4 G PO PACK: 4.0000 g | PACK | Freq: Every day | ORAL | 12 refills | Status: DC

## 2018-07-02 MED ORDER — CHOLESTYRAMINE 4 G PO PACK: 4.0000 g | PACK | Freq: Two times a day (BID) | ORAL | 12 refills | Status: DC

## 2018-07-02 NOTE — Progress Notes (Signed)
Cardiology Office Note:    Date:  07/02/2018   ID:  [name], DOB [birth year], MRN [id number]  PCP:  Sanjuana Kava, MD  Cardiologist:  Sanda Klein, MD   Referring MD: Sanjuana Kava, MD   Chief Complaint  Patient presents with  . Follow-up    History of DVT    History of Present Illness:    Phyllis Austin is a 73 y.o. 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  She has been compliant with anticoagulation with Xarelto since then.  She also has rheumatoid arthritis and is on chronic treatment with Remicade.  She has not had any new episodes of thrombosis and has not had any bleeding or injuries or falls while on treatment with anticoagulants.  She does describe some recent tenderness in her medial lower left thigh, without obvious ecchymosis or hematoma.  She has chronic symmetrical mild swelling of both calves related to superficial varicose veins.  She does not have any asymmetry in the edema at this time.  The patient specifically denies any chest pain at rest exertion, dyspnea at rest or with exertion, orthopnea, paroxysmal nocturnal dyspnea, syncope, palpitations, focal neurological deficits, intermittent claudication, lower extremity edema, unexplained weight gain, cough, hemoptysis or wheezing.  Ever since her cholecystectomy she complains of problems with frequent watery yellowish diarrhea.  She often has to have a bowel movement almost immediately after she eats a meal.  Past Medical History:  Diagnosis Date  . Allergy   . Cataract    surgery to remove  . Diverticulosis   . Gallstones   . GERD (gastroesophageal reflux disease)   . Glaucoma    no med - just watching  . Hepatitis    as a child  . Hiatal hernia   . Osteoarthritis   . Psoriatic arthritis ([place])   . RA (rheumatoid arthritis) ([place])     Past Surgical History:  Procedure Laterality Date  . BUNIONECTOMY Right    with correction  . CESAREAN  SECTION  10/21/79, 03/28/81   x 2  . CHOLECYSTECTOMY N/A 04/12/2015   Procedure: LAPAROSCOPIC CHOLECYSTECTOMY;  Surgeon: [name], MD;  Location: [place];  Service: General;  Laterality: N/A;  . COLONOSCOPY  4/20006   Sharlett Iles  . DILATION AND CURETTAGE OF UTERUS    . EYE SURGERY Bilateral    cataract surgery w/ lens implant  . INTRAOCULAR LENS INSERTION Bilateral   . TIBIA FRACTURE SURGERY Right 1986   Bone transplant  . WISDOM TOOTH EXTRACTION      Current Medications: Current Meds  Medication Sig  . acetaminophen (TYLENOL) 650 MG CR tablet Take 1,300 mg by mouth every 8 (eight) hours as needed for pain.  Marland Kitchen albuterol (PROVENTIL HFA;VENTOLIN HFA) 108 (90 Base) MCG/ACT inhaler Inhale 2 puffs into the lungs every 4 (four) hours as needed for wheezing or shortness of breath.  . Biotin 5 MG TABS Take 1 tablet by mouth at bedtime.  . calcium acetate (PHOSLO) 667 MG capsule Take 667 mg by mouth at bedtime.   . Calcium Polycarbophil (FIBER) 625 MG TABS Take 1 tablet by mouth at bedtime.   Marland Kitchen doxycycline (VIBRAMYCIN) 100 MG capsule Take 1 capsule (100 mg total) by mouth 2 (two) times daily.  . Doxylamine Succinate, Sleep, (SLEEP AID PO) Take 1 tablet by mouth at bedtime.   . InFLIXimab (REMICADE IV) Inject into the vein See admin instructions. Every 8 weeks at Dr. [name] office  .  pantoprazole (PROTONIX) 20 MG tablet Take 1 tablet (20 mg total) by mouth daily. (Patient taking differently: Take 20 mg by mouth at bedtime. )  . Polyethyl Glycol-Propyl Glycol (SYSTANE OP) Place 1 drop into both eyes daily as needed (dry eyes).  . traMADol (ULTRAM) 50 MG tablet Take 50 mg by mouth at bedtime.   Alveda Reasons 20 MG TABS tablet TAKE 1 TABLET (20 MG TOTAL) BY MOUTH DAILY WITH SUPPER.  . [DISCONTINUED] meloxicam (MOBIC) 15 MG tablet Take 15 mg by mouth at bedtime.      Allergies:   Patient has no known allergies.   Social History   Socioeconomic History  . Marital status: Married    Spouse  name: Not on file  . Number of children: 2  . Years of education: Not on file  . Highest education level: Not on file  Occupational History  . Occupation: retired   Scientific laboratory technician  . Financial resource strain: Not on file  . Food insecurity:    Worry: Not on file    Inability: Not on file  . Transportation needs:    Medical: Not on file    Non-medical: Not on file  Tobacco Use  . Smoking status: Never Smoker  . Smokeless tobacco: Never Used  Substance and Sexual Activity  . Alcohol use: No    Alcohol/week: 0.0 standard drinks  . Drug use: No  . Sexual activity: Not on file  Lifestyle  . Physical activity:    Days per week: Not on file    Minutes per session: Not on file  . Stress: Not on file  Relationships  . Social connections:    Talks on phone: Not on file    Gets together: Not on file    Attends religious service: Not on file    Active member of club or organization: Not on file    Attends meetings of clubs or organizations: Not on file    Relationship status: Not on file  Other Topics Concern  . Not on file  Social History Narrative  . Not on file     Family History: The patient's family history includes Diabetes in her brother, maternal grandmother, and mother; Heart attack in her mother; Transient ischemic attack in her mother. There is no history of Colon cancer, Esophageal cancer, or Stomach cancer.  ROS:   Please see the history of present illness.     All other systems reviewed and are negative.  EKGs/Labs/Other Studies Reviewed:    The following studies were reviewed today: Venous duplex ultrasound June 19  EKG:  EKG is ordered today.  The ekg ordered today demonstrates normal sinus rhythm with mild delayed anterior R wave progression, otherwise normal, QTC 428 ms  Recent Labs: 10/10/2017: ALT 15 12/10/2017: BUN 15; Creatinine, Ser 0.80; Hemoglobin 12.6; Platelets 275; Potassium 4.1; Sodium 140  Recent Lipid Panel No results found for: CHOL, TRIG,  HDL, CHOLHDL, VLDL, LDLCALC, LDLDIRECT  Physical Exam:    VS:  BP 120/80   Pulse 78   Ht 5' 3.5" (1.613 m)   Wt 200 lb (90.7 kg)   BMI 34.87 kg/m     Wt Readings from Last 3 Encounters:  07/02/18 200 lb (90.7 kg)  12/15/17 192 lb (87.1 kg)  12/10/17 190 lb (86.2 kg)      General: Alert, oriented x3, no distress, moderately obese Head: no evidence of trauma, PERRL, EOMI, no exophtalmos or lid lag, no myxedema, no xanthelasma; normal ears, nose and oropharynx  Neck: normal jugular venous pulsations and no hepatojugular reflux; brisk carotid pulses without delay and no carotid bruits Chest: clear to auscultation, no signs of consolidation by percussion or palpation, normal fremitus, symmetrical and full respiratory excursions Cardiovascular: normal position and quality of the apical impulse, regular rhythm, normal first and second heart sounds, no murmurs, rubs or gallops Abdomen: no tenderness or distention, no masses by palpation, no abnormal pulsatility or arterial bruits, normal bowel sounds, no hepatosplenomegaly Extremities: no clubbing, cyanosis or edema; 2+ radial, ulnar and brachial pulses bilaterally; 2+ right femoral, posterior tibial and dorsalis pedis pulses; 2+ left femoral, posterior tibial and dorsalis pedis pulses; no subclavian or femoral bruits Neurological: grossly nonfocal Psych: Normal mood and affect   ASSESSMENT:    1. History of DVT (deep vein thrombosis)   2. Long term current use of anticoagulant   3. Peripheral venous insufficiency   4. Rheumatoid arthritis involving multiple sites, unspecified rheumatoid factor presence ([place])   5. Bile salt-induced diarrhea    PLAN:    In order of problems listed above:  1. DVT: Seems to have fewer problems with edema than at her last appointment.  Suspect she does have a small degree of postphlebitic syndrome, but this is not causing serious issues.  Her clot was unprovoked and I think she will be on lifelong  anticoagulation, although at this point she can probably safely interrupt anticoagulation briefly for medical procedures or bleeding complications. 2. Xarelto: Well-tolerated without serious bleeding complications.  She has occasional bruising. 3. Peripheral venous insufficiency: Predates the DVT by many years and is likely an inherited trait.  Discussed weight loss, compression stockings, leg elevation. 4. RA: No longer on NSAIDs.  Takes acetaminophen and occasional tramadol.  Arthritic complaints are generally well controlled. 5. Diarrhea: Suspect she may be having bile salt diarrhea.  Gave her a prescription for cholestyramine.  She should start 4 g once daily and increase to twice daily if there is no improvement.  Reported the fact that Protonix can also cause diarrhea, but she reports that this is a necessary medication.  If she does not take it, she has horrible painful heartburn.   Medication Adjustments/Labs and Tests Ordered: Current medicines are reviewed at length with the patient today.  Concerns regarding medicines are outlined above.  Orders Placed This Encounter  Procedures  . EKG 12-Lead   Meds ordered this encounter  Medications  . DISCONTD: cholestyramine (QUESTRAN) 4 g packet    Sig: Take 1 packet (4 g total) by mouth daily.    Dispense:  60 each    Refill:  12  . cholestyramine (QUESTRAN) 4 g packet    Sig: Take 1 packet (4 g total) by mouth 2 (two) times daily.    Dispense:  60 each    Refill:  12    Please disregard refill previously sent in. Update in signature.    Patient Instructions  Medication Instructions:  Dr Sallyanne Kuster has recommended making the following medication changes: 1. START Cholestyramine 4 g - take 1 packet daily. Okay to use more if needed  If you need a refill on your cardiac medications before your next appointment, please call your pharmacy.   Follow-Up: At [facility], you and your health needs are our priority.  As part of our  continuing mission to provide you with exceptional heart care, we have created designated Provider Care Teams.  These Care Teams include your primary Cardiologist (physician) and Advanced Practice Providers (APPs -  Physician Assistants  and Nurse Practitioners) who all work together to provide you with the care you need, when you need it. You will need a follow up appointment in 12 months.  Please call our office 2 months in advance to schedule this appointment.  You may see Sanda Klein, MD or one of the following Advanced Practice Providers on your designated Care Team: [place], [place] . Fabian Sharp, PA-C . You will receive a reminder letter in the mail two months in advance. If you don't receive a letter, please call our office to schedule the follow-up appointment.    Signed, Sanda Klein, MD  07/02/2018 11:15 AM    [facility]

## 2018-07-02 NOTE — Patient Instructions (Signed)
Medication Instructions:  Dr Sallyanne Kuster has recommended making the following medication changes: 1. START Cholestyramine 4 g - take 1 packet daily. Okay to use more if needed  If you need a refill on your cardiac medications before your next appointment, please call your pharmacy.   Follow-Up: At [facility], you and your health needs are our priority.  As part of our continuing mission to provide you with exceptional heart care, we have created designated Provider Care Teams.  These Care Teams include your primary Cardiologist (physician) and Advanced Practice Providers (APPs -  Physician Assistants and Nurse Practitioners) who all work together to provide you with the care you need, when you need it. You will need a follow up appointment in 12 months.  Please call our office 2 months in advance to schedule this appointment.  You may see Sanda Klein, MD or one of the following Advanced Practice Providers on your designated Care Team: [place], [place] . Fabian Sharp, PA-C . You will receive a reminder letter in the mail two months in advance. If you don't receive a letter, please call our office to schedule the follow-up appointment.

## 2018-08-17 DIAGNOSIS — B029 Zoster without complications: Secondary | ICD-10-CM | POA: Diagnosis not present

## 2018-08-17 DIAGNOSIS — L578 Other skin changes due to chronic exposure to nonionizing radiation: Secondary | ICD-10-CM | POA: Diagnosis not present

## 2018-08-24 DIAGNOSIS — L4050 Arthropathic psoriasis, unspecified: Secondary | ICD-10-CM | POA: Diagnosis not present

## 2018-08-26 DIAGNOSIS — L820 Inflamed seborrheic keratosis: Secondary | ICD-10-CM | POA: Diagnosis not present

## 2018-08-26 DIAGNOSIS — B0223 Postherpetic polyneuropathy: Secondary | ICD-10-CM | POA: Diagnosis not present

## 2018-09-02 DIAGNOSIS — B0223 Postherpetic polyneuropathy: Secondary | ICD-10-CM | POA: Diagnosis not present

## 2018-10-20 DIAGNOSIS — L4050 Arthropathic psoriasis, unspecified: Secondary | ICD-10-CM | POA: Diagnosis not present

## 2018-11-30 ENCOUNTER — Other Ambulatory Visit: Payer: Self-pay

## 2018-11-30 MED ORDER — RIVAROXABAN 20 MG PO TABS: ORAL_TABLET | ORAL | 6 refills | Status: DC

## 2018-11-30 NOTE — Telephone Encounter (Signed)
Pt is a 73 yr female who saw Dr. Sallyanne Kuster on 07/02/18 weight at that visit was 90.7Kg. SCr on 06/23/18 was 0.80, CrCl is 59mL/min. Will refill Xarelto 20mg  QD.

## 2018-12-15 DIAGNOSIS — Z79899 Other long term (current) drug therapy: Secondary | ICD-10-CM | POA: Diagnosis not present

## 2018-12-15 DIAGNOSIS — L4050 Arthropathic psoriasis, unspecified: Secondary | ICD-10-CM | POA: Diagnosis not present

## 2019-02-09 DIAGNOSIS — L4050 Arthropathic psoriasis, unspecified: Secondary | ICD-10-CM | POA: Diagnosis not present

## 2019-03-28 DIAGNOSIS — Z23 Encounter for immunization: Secondary | ICD-10-CM | POA: Diagnosis not present

## 2019-03-29 ENCOUNTER — Telehealth: Payer: Self-pay | Admitting: Cardiovascular Disease

## 2019-03-29 DIAGNOSIS — R600 Localized edema: Secondary | ICD-10-CM

## 2019-03-29 NOTE — Telephone Encounter (Signed)
New message   Pt c/o swelling: STAT is pt has developed SOB within 24 hours  1) How much weight have you gained and in what time span? No   2) If swelling, where is the swelling located? Foot and ankles   3) Are you currently taking a fluid pill? No   4) Are you currently SOB? No   Do you have a log of your daily weights (if so, list)?no   Have you gained 3 pounds in a day or 5 pounds in a week? No   5) Have you traveled recently?no

## 2019-03-29 NOTE — Telephone Encounter (Signed)
Returned call to pt she states that she noticed last week she noticed swelling in her left foot and ankles and bilateral hans and somewhat in the arms. She has h/o blood clots.She states that she rested over the weekend and states that her LE swelling has went down. But over the weekend the swelling in her Bilateral hands has increased. She does not know why, no changes in her diet. Nothing new that she admits. She states that she does not take her weight. She states that last week she states that she would "push on her left foot with her right heel and this would leave an indentation after. But she has been "resting and the swelling has went down. She just thought that she should call and let Dr C know d/t h/o clots. She states that she will continue with daily routines to see how the swelling goes today.  Also over the weekend she noticed that she felt her heart racing so went to her friends house to take her BP and HR. When she first got there her BP was 150/82 HR 97 then about an hour later it was 129/85 HR 85. She states that this has not happened before so that is why she has taken her BP at her friends, as she does not have a BP cuff. She states that her BP has always been great. This has her confused. She has not continued to take her BP since she does not have a cuff.  She will take her weight now and continue daily making a notations other current swelling. Any thing else she should do?

## 2019-03-29 NOTE — Telephone Encounter (Signed)
With bilateral swelling it is less likely to be clot related. Since BP is also up, it sounds like salt/fluid retention. Sometimes that can be triggered by arthritis therapies including Remicade and steroids.  Weight monitoring will be very helpful. Avoid salty foods (deli foods, canned soups//veggies/pasta, etc).  We can prescribe a diuretic if necessary, but I would like her to get an echo as well (preferably echo first).

## 2019-03-29 NOTE — Telephone Encounter (Signed)
Returned the call to the patient. She has agreed to the echo. The order has been placed and scheduling notified.   She has been advised to monitor her weight daily and to keep a log of these readings. She will also try to limit her intake of salty foods.   She did say that the swelling has gone down the more she elevates her feet.

## 2019-04-01 ENCOUNTER — Other Ambulatory Visit: Payer: Self-pay

## 2019-04-01 ENCOUNTER — Ambulatory Visit (HOSPITAL_COMMUNITY): Payer: Medicare Other | Attending: Cardiology

## 2019-04-01 DIAGNOSIS — R600 Localized edema: Secondary | ICD-10-CM | POA: Insufficient documentation

## 2019-04-06 DIAGNOSIS — L4050 Arthropathic psoriasis, unspecified: Secondary | ICD-10-CM | POA: Diagnosis not present

## 2019-06-01 DIAGNOSIS — L4050 Arthropathic psoriasis, unspecified: Secondary | ICD-10-CM | POA: Diagnosis not present

## 2019-06-02 DIAGNOSIS — Z1231 Encounter for screening mammogram for malignant neoplasm of breast: Secondary | ICD-10-CM | POA: Diagnosis not present

## 2019-06-28 ENCOUNTER — Other Ambulatory Visit: Payer: Self-pay | Admitting: Cardiovascular Disease

## 2019-08-04 ENCOUNTER — Encounter (INDEPENDENT_AMBULATORY_CARE_PROVIDER_SITE_OTHER): Payer: Self-pay

## 2019-08-04 ENCOUNTER — Other Ambulatory Visit: Payer: Self-pay

## 2019-08-04 ENCOUNTER — Encounter: Payer: Self-pay | Admitting: Cardiovascular Disease

## 2019-08-04 ENCOUNTER — Ambulatory Visit (INDEPENDENT_AMBULATORY_CARE_PROVIDER_SITE_OTHER): Payer: Medicare Other | Admitting: Cardiovascular Disease

## 2019-08-04 VITALS — BP 131/71 | HR 86 | Ht 63.5 in | Wt 203.0 lb

## 2019-08-04 DIAGNOSIS — M069 Rheumatoid arthritis, unspecified: Secondary | ICD-10-CM

## 2019-08-04 DIAGNOSIS — Z86718 Personal history of other venous thrombosis and embolism: Secondary | ICD-10-CM | POA: Diagnosis not present

## 2019-08-04 DIAGNOSIS — K9089 Other intestinal malabsorption: Secondary | ICD-10-CM

## 2019-08-04 DIAGNOSIS — I872 Venous insufficiency (chronic) (peripheral): Secondary | ICD-10-CM | POA: Diagnosis not present

## 2019-08-04 DIAGNOSIS — Z7901 Long term (current) use of anticoagulants: Secondary | ICD-10-CM

## 2019-08-04 MED ORDER — RIVAROXABAN 20 MG PO TABS: ORAL_TABLET | ORAL | 3 refills | Status: DC

## 2019-08-04 NOTE — Patient Instructions (Signed)

## 2019-08-04 NOTE — Progress Notes (Signed)
Cardiology Office Note:    Date:  08/11/2019   ID:  Phyllis Austin, DOB Nov 01, 1945, MRN [id number]  PCP:  Sanjuana Kava, MD  Cardiologist:  Sanda Klein, MD   Referring MD: Sanjuana Kava, MD   Chief Complaint  Patient presents with  . Follow-up    History of DVT left femoral vein    History of Present Illness:    Phyllis Austin is a 74 y.o. 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  She has been compliant with anticoagulation with Xarelto since then.  She also has rheumatoid arthritis and plaque psoriasis and is on chronic treatment with Remicade.  She has not had any recent cardiovascular complaints and has not had palpitations.  She has occasional bilateral mild ankle swelling that resolves by the next day.  Methotrexate has been added to her chronic treatment with Remicade recently.  (Dr. Amil Amen, [place] [place], [place]).  She has not had any recent falls, injuries or serious bleeding problems.  The patient specifically denies any chest pain at rest exertion, dyspnea at rest or with exertion, orthopnea, paroxysmal nocturnal dyspnea, syncope, palpitations, focal neurological deficits, intermittent claudication, lower extremity edema, unexplained weight gain, cough, hemoptysis or wheezing.   Past Medical History:  Diagnosis Date  . Allergy   . Cataract    surgery to remove  . Diverticulosis   . Gallstones   . GERD (gastroesophageal reflux disease)   . Glaucoma    no med - just watching  . Hepatitis    as a child  . Hiatal hernia   . Osteoarthritis   . Psoriatic arthritis ([place])   . RA (rheumatoid arthritis) ([place])     Past Surgical History:  Procedure Laterality Date  . BUNIONECTOMY Right    with correction  . CESAREAN SECTION  10/21/79, 03/28/81   x 2  . CHOLECYSTECTOMY N/A 04/12/2015   Procedure: LAPAROSCOPIC CHOLECYSTECTOMY;  Surgeon: [name], MD;  Location: [facility];  Service: General;  Laterality: N/A;  .  COLONOSCOPY  4/20006   [facility]  . DILATION AND CURETTAGE OF UTERUS    . EYE SURGERY Bilateral    cataract surgery w/ lens implant  . INTRAOCULAR LENS INSERTION Bilateral   . TIBIA FRACTURE SURGERY Right 1986   Bone transplant  . WISDOM TOOTH EXTRACTION      Current Medications: Current Meds  Medication Sig  . acetaminophen (TYLENOL) 650 MG CR tablet Take 1,300 mg by mouth every 8 (eight) hours as needed for pain.  Marland Kitchen albuterol (PROVENTIL HFA;VENTOLIN HFA) 108 (90 Base) MCG/ACT inhaler Inhale 2 puffs into the lungs every 4 (four) hours as needed for wheezing or shortness of breath.  . Biotin 5 MG TABS Take 1 tablet by mouth at bedtime.  . calcium acetate (PHOSLO) 667 MG capsule Take 667 mg by mouth at bedtime.   . Calcium Polycarbophil (FIBER) 625 MG TABS Take 1 tablet by mouth at bedtime.   . cholestyramine (QUESTRAN) 4 g packet Take 1 packet (4 g total) by mouth 2 (two) times daily.  Marland Kitchen doxycycline (VIBRAMYCIN) 100 MG capsule Take 1 capsule (100 mg total) by mouth 2 (two) times daily.  . Doxylamine Succinate, Sleep, (SLEEP AID PO) Take 1 tablet by mouth at bedtime.   . InFLIXimab (REMICADE IV) Inject into the vein See admin instructions. Every 8 weeks at Dr. [name] office  . pantoprazole (PROTONIX) 20 MG tablet Take 1 tablet (20 mg total) by mouth daily. (Patient  taking differently: Take 20 mg by mouth at bedtime. )  . Polyethyl Glycol-Propyl Glycol (SYSTANE OP) Place 1 drop into both eyes daily as needed (dry eyes).  . rivaroxaban (XARELTO) 20 MG TABS tablet TAKE 1 TABLET (20 MG TOTAL) BY MOUTH DAILY WITH SUPPER.  . traMADol (ULTRAM) 50 MG tablet Take 50 mg by mouth at bedtime.   . [DISCONTINUED] XARELTO 20 MG TABS tablet TAKE 1 TABLET (20 MG TOTAL) BY MOUTH DAILY WITH SUPPER.     Allergies:   Patient has no known allergies.   Social History   Socioeconomic History  . Marital status: Married    Spouse name: Not on file  . Number of children: 2  . Years of education: Not on  file  . Highest education level: Not on file  Occupational History  . Occupation: retired   Tobacco Use  . Smoking status: Never Smoker  . Smokeless tobacco: Never Used  Substance and Sexual Activity  . Alcohol use: No    Alcohol/week: 0.0 standard drinks  . Drug use: No  . Sexual activity: Not on file  Other Topics Concern  . Not on file  Social History Narrative  . Not on file   Social Determinants of Health   Financial Resource Strain:   . Difficulty of Paying Living Expenses: Not on file  Food Insecurity:   . Worried About Charity fundraiser in the Last Year: Not on file  . Ran Out of Food in the Last Year: Not on file  Transportation Needs:   . Lack of Transportation (Medical): Not on file  . Lack of Transportation (Non-Medical): Not on file  Physical Activity:   . Days of Exercise per Week: Not on file  . Minutes of Exercise per Session: Not on file  Stress:   . Feeling of Stress : Not on file  Social Connections:   . Frequency of Communication with Friends and Family: Not on file  . Frequency of Social Gatherings with Friends and Family: Not on file  . Attends Religious Services: Not on file  . Active Member of Clubs or Organizations: Not on file  . Attends Archivist Meetings: Not on file  . Marital Status: Not on file     Family History: The patient's family history includes Diabetes in her brother, maternal grandmother, and mother; Heart attack in her mother; Transient ischemic attack in her mother. There is no history of Colon cancer, Esophageal cancer, or Stomach cancer.  ROS:   Please see the history of present illness.     All other systems reviewed and are negative.  EKGs/Labs/Other Studies Reviewed:    The following studies were reviewed today: Venous duplex ultrasound June 19  EKG:  EKG is ordered today.  The ekg ordered today demonstrates normal sinus rhythm with mild delayed anterior R wave progression, otherwise normal, QTC 428  ms  Recent Labs: No results found for requested labs within last 8760 hours.  Recent Lipid Panel No results found for: CHOL, TRIG, HDL, CHOLHDL, VLDL, LDLCALC, LDLDIRECT  Physical Exam:    VS:  BP 131/71   Pulse 86   Ht 5' 3.5" (1.613 m)   Wt 203 lb (92.1 kg)   BMI 35.40 kg/m     Wt Readings from Last 3 Encounters:  08/04/19 203 lb (92.1 kg)  07/02/18 200 lb (90.7 kg)  12/15/17 192 lb (87.1 kg)    General: Alert, oriented x3, no distress, severe obesity Head: no evidence of trauma,  PERRL, EOMI, no exophtalmos or lid lag, no myxedema, no xanthelasma; normal ears, nose and oropharynx Neck: normal jugular venous pulsations and no hepatojugular reflux; brisk carotid pulses without delay and no carotid bruits Chest: clear to auscultation, no signs of consolidation by percussion or palpation, normal fremitus, symmetrical and full respiratory excursions Cardiovascular: normal position and quality of the apical impulse, regular rhythm, normal first and second heart sounds, no murmurs, rubs or gallops Abdomen: no tenderness or distention, no masses by palpation, no abnormal pulsatility or arterial bruits, normal bowel sounds, no hepatosplenomegaly Extremities: no clubbing, cyanosis or edema; 2+ radial, ulnar and brachial pulses bilaterally; 2+ right femoral, posterior tibial and dorsalis pedis pulses; 2+ left femoral, posterior tibial and dorsalis pedis pulses; no subclavian or femoral bruits Neurological: grossly nonfocal Psych: Normal mood and affect   ASSESSMENT:    1. History of DVT (deep vein thrombosis)   2. Long term current use of anticoagulant   3. Peripheral venous insufficiency   4. Rheumatoid arthritis, involving unspecified site, unspecified whether rheumatoid factor present ([place])   5. Bile salt-induced diarrhea    PLAN:    In order of problems listed above:  1. DVT: Edema has been less troublesome recently. 2. Xarelto: Other than easy bruising seems to be  well-tolerated.  Hemoglobin 14.2 on 06/01/2019 3. Peripheral venous insufficiency: Predates the DVT by many years and is likely an inherited trait.  Commended compression stockings during the day, leg elevation whenever possible and weight loss would be highly beneficial 4. RA: Avoiding NSAIDs due to the increased bleeding risk.  On Remicade and recently started on methotrexate. 5. Diarrhea: Cholestyramine seems to be helping with the diarrhea.  Needs to continue pantoprazole because otherwise she has very bad heartburn.   Medication Adjustments/Labs and Tests Ordered: Current medicines are reviewed at length with the patient today.  Concerns regarding medicines are outlined above.  Orders Placed This Encounter  Procedures  . EKG 12-Lead   Meds ordered this encounter  Medications  . rivaroxaban (XARELTO) 20 MG TABS tablet    Sig: TAKE 1 TABLET (20 MG TOTAL) BY MOUTH DAILY WITH SUPPER.    Dispense:  90 tablet    Refill:  3    Patient Instructions  Medication Instructions:  No changes *If you need a refill on your cardiac medications before your next appointment, please call your pharmacy*  Lab Work: None ordered If you have labs (blood work) drawn today and your tests are completely normal, you will receive your results only by: Marland Kitchen MyChart Message (if you have MyChart) OR . A paper copy in the mail If you have any lab test that is abnormal or we need to change your treatment, we will call you to review the results.  Testing/Procedures: None ordered  Follow-Up: At [facility], you and your health needs are our priority.  As part of our continuing mission to provide you with exceptional heart care, we have created designated Provider Care Teams.  These Care Teams include your primary Cardiologist (physician) and Advanced Practice Providers (APPs -  Physician Assistants and Nurse Practitioners) who all work together to provide you with the care you need, when you need it.  Your  next appointment:   12 month(s)  The format for your next appointment:   In Person  Provider:   Sanda Klein, MD      Signed, Sanda Klein, MD  08/11/2019 6:16 PM    [place]

## 2019-08-11 ENCOUNTER — Encounter: Payer: Self-pay | Admitting: Cardiovascular Disease

## 2020-03-10 IMAGING — US US EXTREM LOW VENOUS*L*
1 series · 13 of 24 positions shown · non-contrast
Comparison: None.

CLINICAL DATA: Left lower extremity pain and edema for the past 5
weeks. History of prior DVT. Evaluate for acute or chronic DVT.



[Series 1: us extrem low venous*left* · 0.08mm/px · 13 of 34 slices shown]
[im 1/34]
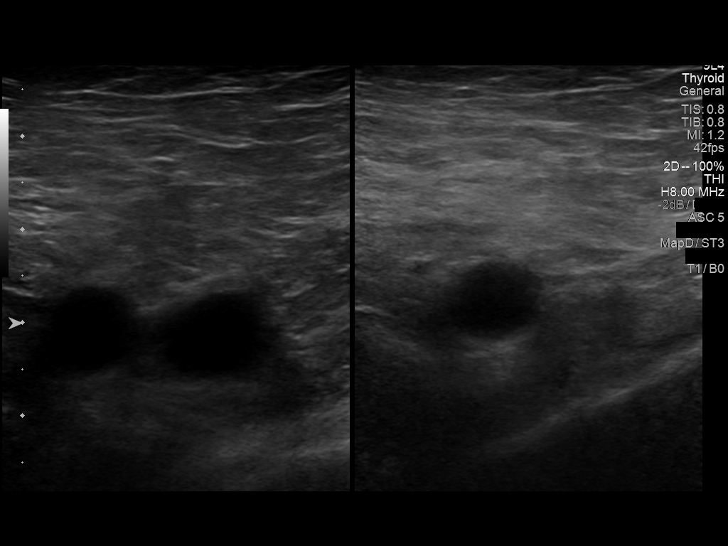
[im 3/34]
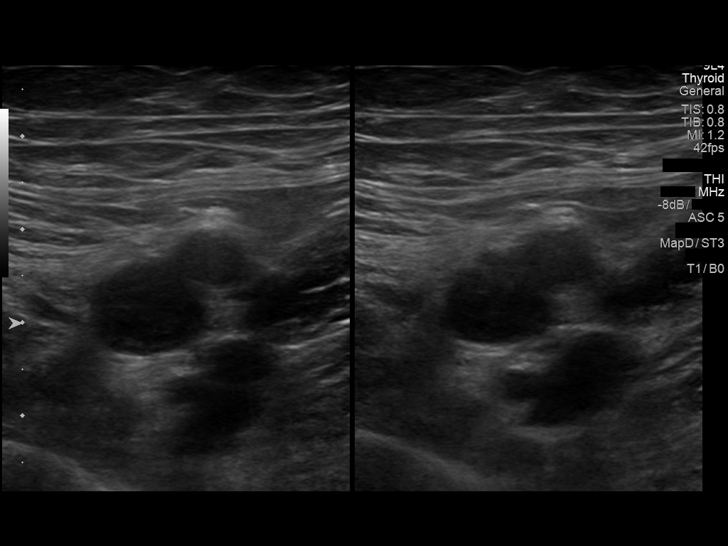
[im 6/34]
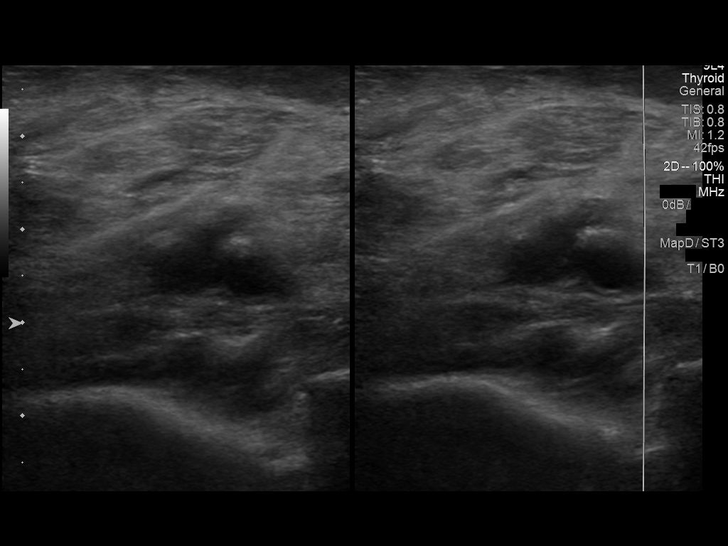
[im 9/34]
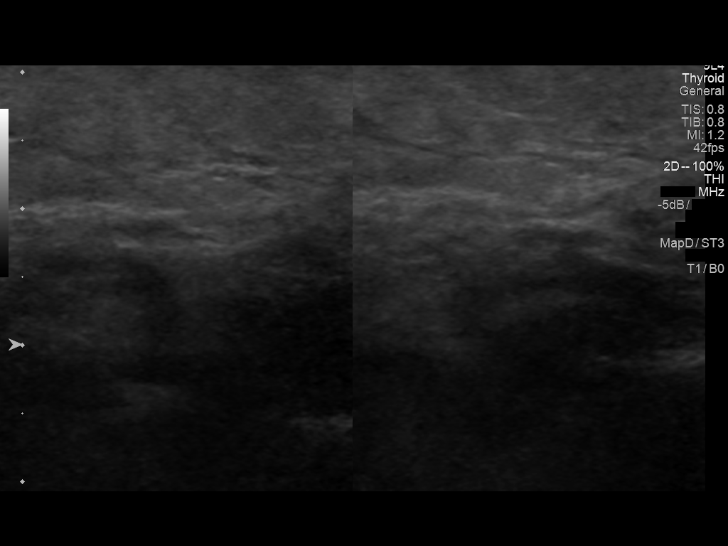
[im 12/34]
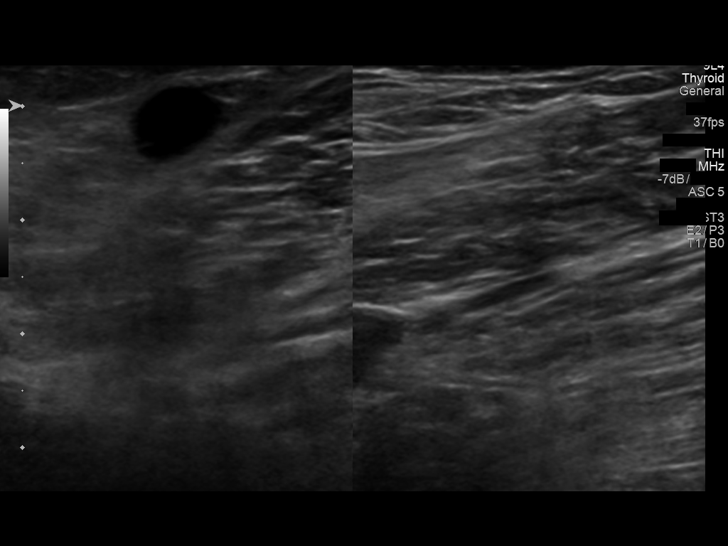
[im 15/34]
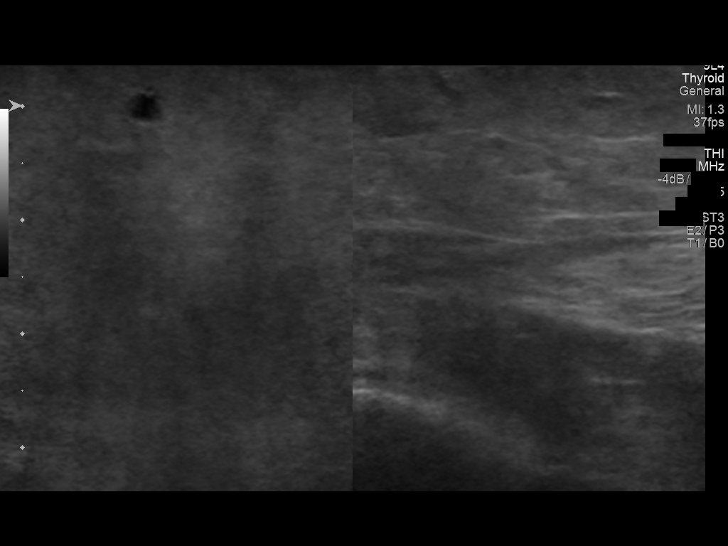
[im 18/34]
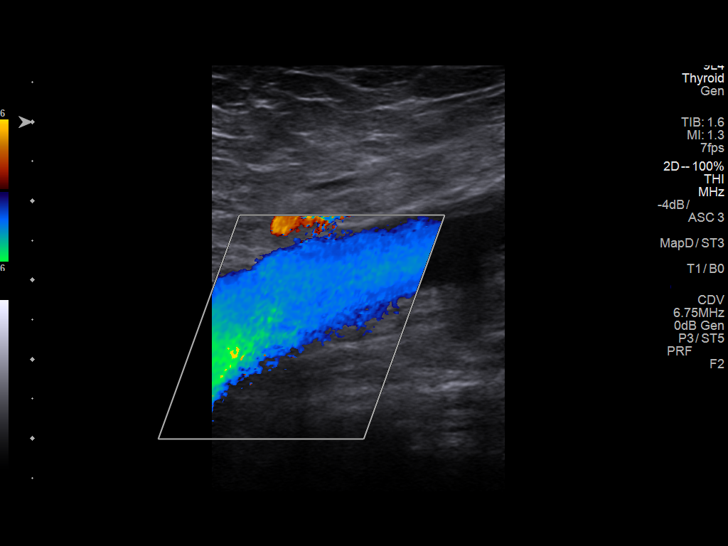
[im 19/34]
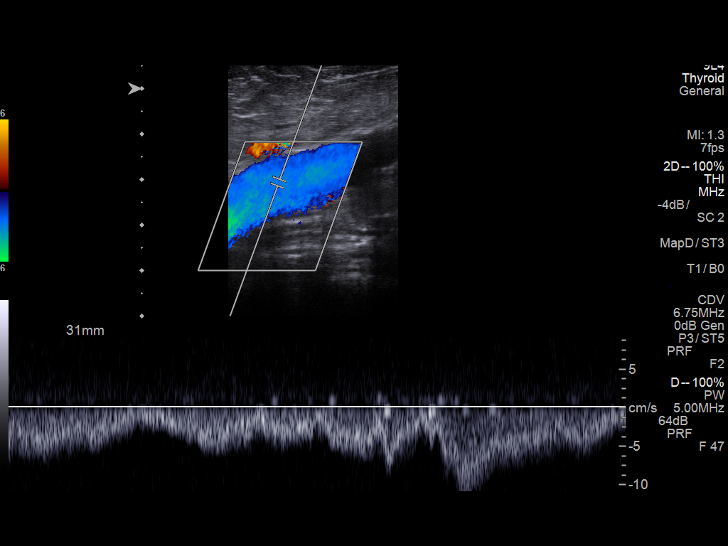
[im 22/34]
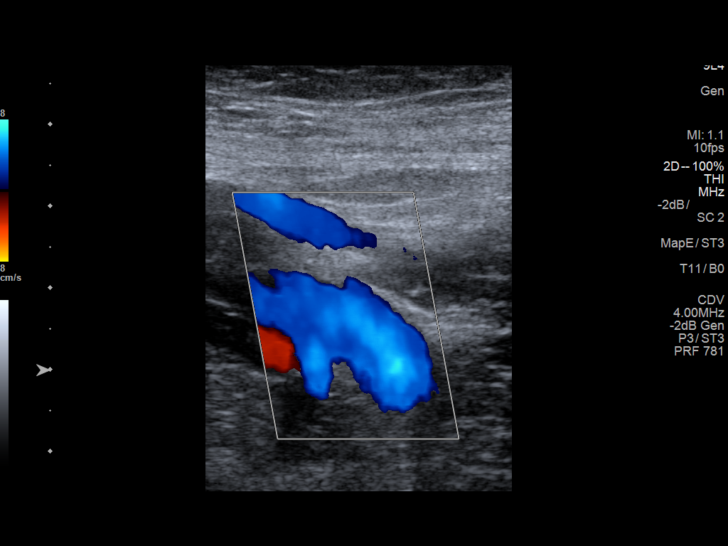
[im 25/34]
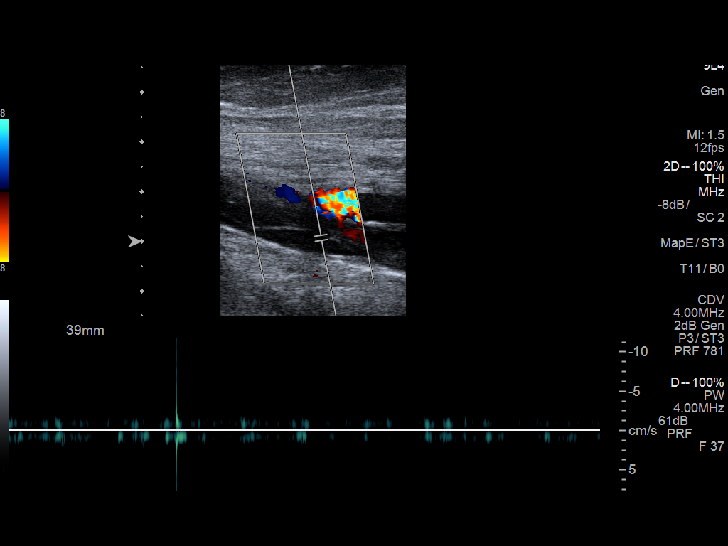
[im 28/34]
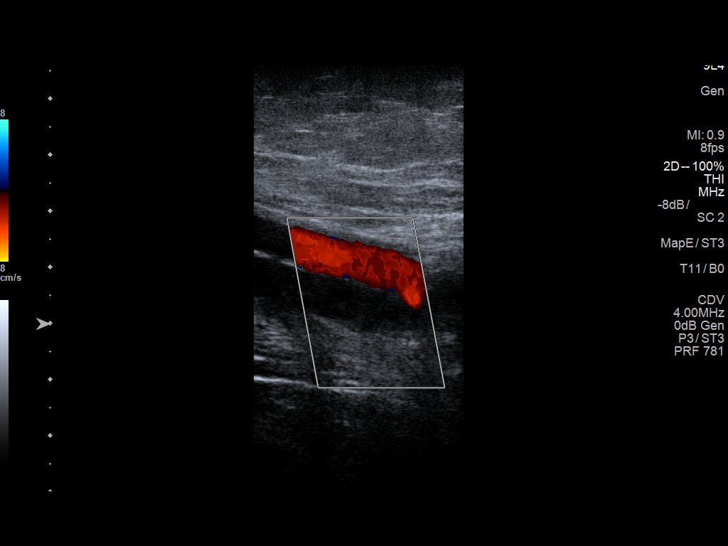
[im 31/34]
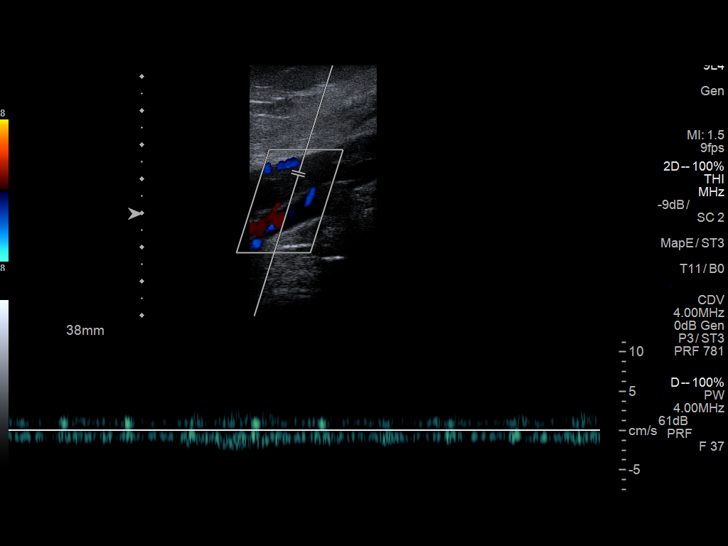
[im 34/34]
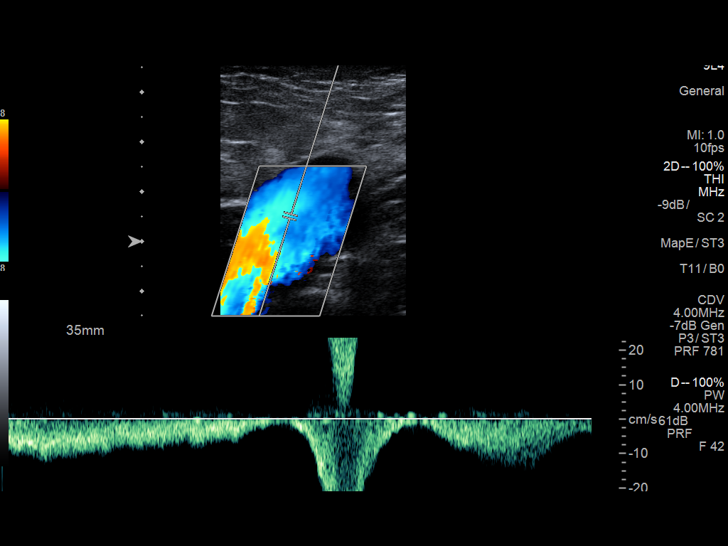

[13 of 24 positions shown; findings below may reference images not displayed]

FINDINGS: Contralateral Common Femoral Vein: Respiratory phasicity is normal
and symmetric with the symptomatic side. No evidence of thrombus.
Normal compressibility.

Common Femoral Vein: No evidence of thrombus. Normal
compressibility, respiratory phasicity and response to augmentation.

Saphenofemoral Junction: No evidence of thrombus. Normal
compressibility and flow on color Doppler imaging.

Profunda Femoral Vein: No evidence of thrombus. Normal
compressibility and flow on color Doppler imaging.

Femoral Vein: There is mixed echogenic occlusive DVT within the
proximal (image 26) mid (image 28) and distal (image 30) aspects of
the left femoral vein.

Popliteal Vein: There is hypoechoic occlusive DVT within the left
popliteal vein (image 33).

Calf Veins: Not imaged

Superficial Great Saphenous Vein: No evidence of thrombus. Normal
compressibility.

Venous Reflux:  None.

Other Findings:  None.
IMPRESSION: Examination is positive for age-indeterminate occlusive DVT
involving the left femoral and popliteal veins. In the absence of
prior examinations, an acute on chronic process is not excluded.
Clinical correlation is advised.

## 2020-07-13 ENCOUNTER — Telehealth: Payer: Self-pay | Admitting: Cardiovascular Disease

## 2020-07-13 MED ORDER — RIVAROXABAN 20 MG PO TABS: ORAL_TABLET | ORAL | 6 refills | Status: DC

## 2020-07-13 NOTE — Telephone Encounter (Signed)
    Pt c/o medication issue:  1. Name of Medication: rivaroxaban (XARELTO) 20 MG TABS tablet  2. How are you currently taking this medication (dosage and times per day)? TAKE 1 TABLET (20 MG TOTAL) BY MOUTH DAILY WITH SUPPER.  3. Are you having a reaction (difficulty breathing--STAT)?   4. What is your medication issue? Pt says xarelto cost her $47 for 30 days supply, getting it for 90 days supply at a time is too much for her. She would like to get 30 days at a time, she tried requesting it to her pharmacy but she was told to call [place] office to request it.

## 2020-07-13 NOTE — Telephone Encounter (Signed)
Spoke to patient she stated she wants Xarelto refill changed to a 30 day supply.Stated she cannot afford 90 day supply.30 day refill sent to pharmacy.

## 2020-09-20 ENCOUNTER — Encounter: Payer: Self-pay | Admitting: Cardiovascular Disease

## 2020-09-20 ENCOUNTER — Other Ambulatory Visit: Payer: Self-pay

## 2020-09-20 ENCOUNTER — Ambulatory Visit (INDEPENDENT_AMBULATORY_CARE_PROVIDER_SITE_OTHER): Payer: Medicare Other | Admitting: Cardiovascular Disease

## 2020-09-20 VITALS — BP 140/80 | HR 100 | Ht 62.0 in | Wt 190.6 lb

## 2020-09-20 DIAGNOSIS — Z86718 Personal history of other venous thrombosis and embolism: Secondary | ICD-10-CM | POA: Diagnosis not present

## 2020-09-20 DIAGNOSIS — I872 Venous insufficiency (chronic) (peripheral): Secondary | ICD-10-CM

## 2020-09-20 DIAGNOSIS — R079 Chest pain, unspecified: Secondary | ICD-10-CM | POA: Diagnosis not present

## 2020-09-20 DIAGNOSIS — M069 Rheumatoid arthritis, unspecified: Secondary | ICD-10-CM

## 2020-09-20 DIAGNOSIS — R0602 Shortness of breath: Secondary | ICD-10-CM

## 2020-09-20 DIAGNOSIS — Z7901 Long term (current) use of anticoagulants: Secondary | ICD-10-CM | POA: Diagnosis not present

## 2020-09-20 NOTE — Progress Notes (Signed)
Cardiology Office Note:    Date:  09/21/2020   ID:  Phyllis Austin, DOB 17-Jan-1946, MRN [id number]  PCP:  Sanjuana Kava, MD  Cardiologist:  Sanda Klein, MD   Referring MD: Sanjuana Kava, MD   Chief Complaint  Patient presents with  . Shortness of Breath  . Chest Pain    History of Present Illness:    Phyllis Austin is a 75 y.o. 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  She has been compliant with anticoagulation with Xarelto since then.  She also has rheumatoid arthritis and plaque psoriasis and is on chronic treatment with Remicade.  She is not feeling well.  10 days ago she drove back home from [facility].  The drive took about 3 to 4 hours and she stopped and walked every hour or so.  Towards the very end of her drive she began experiencing chest discomfort.  She has been short of breath ever since and even walking in her house makes her dyspneic.  She has been sleeping a lot.  She has bilateral mild-moderate calf edema which has not changed from her previous baseline and is symmetrical and not painful.  She does not describe clear-cut orthopnea or PND, but does say that sometimes she sits up so that she can breathe better.  She continues to have mild chest pressure unabated for the last week.  She has not had a persistent cough or hemoptysis, but if she does cough she has a "catch" in the sides of her chest bilaterally.  She denies any recent injuries, falls or bleeding problems.  She reports that she is always extremely attentive to her Xarelto and has not missed any doses.  She always takes it with a meal.  Her electrocardiogram today shows normal sinus rhythm right at 100 bpm and is otherwise a completely normal tracing.  Her blood pressure is normal.  The tachycardia persisted even after she had been sitting down for about 20 minutes.  She is no longer taking methotrexate is just on Remicade.  Followed by Dr. Amil Amen and  Marella Chimes at the [facility]    Past Medical History:  Diagnosis Date  . Allergy   . Cataract    surgery to remove  . Diverticulosis   . Gallstones   . GERD (gastroesophageal reflux disease)   . Glaucoma    no med - just watching  . Hepatitis    as a child  . Hiatal hernia   . Osteoarthritis   . Psoriatic arthritis ([place])   . RA (rheumatoid arthritis) ([place])     Past Surgical History:  Procedure Laterality Date  . BUNIONECTOMY Right    with correction  . CESAREAN SECTION  10/21/79, 03/28/81   x 2  . CHOLECYSTECTOMY N/A 04/12/2015   Procedure: LAPAROSCOPIC CHOLECYSTECTOMY;  Surgeon: [name], MD;  Location: [facility];  Service: General;  Laterality: N/A;  . COLONOSCOPY  4/20006   Sharlett Iles  . DILATION AND CURETTAGE OF UTERUS    . EYE SURGERY Bilateral    cataract surgery w/ lens implant  . INTRAOCULAR LENS INSERTION Bilateral   . TIBIA FRACTURE SURGERY Right 1986   Bone transplant  . WISDOM TOOTH EXTRACTION      Current Medications: Current Meds  Medication Sig  . acetaminophen (TYLENOL) 650 MG CR tablet Take 1,300 mg by mouth every 8 (eight) hours as needed for pain.  . Biotin 5 MG  TABS Take 1 tablet by mouth at bedtime.  . calcium acetate (PHOSLO) 667 MG capsule Take 667 mg by mouth at bedtime.   . Calcium Polycarbophil (FIBER) 625 MG TABS Take 1 tablet by mouth at bedtime.   . cholestyramine (QUESTRAN) 4 g packet Take 1 packet (4 g total) by mouth 2 (two) times daily.  Marland Kitchen doxycycline (VIBRAMYCIN) 100 MG capsule Take 1 capsule (100 mg total) by mouth 2 (two) times daily.  . Doxylamine Succinate, Sleep, (SLEEP AID PO) Take 1 tablet by mouth at bedtime.  . InFLIXimab (REMICADE IV) Inject into the vein See admin instructions. Every 8 weeks at Dr. [name] office  . pantoprazole (PROTONIX) 20 MG tablet Take 1 tablet (20 mg total) by mouth daily. (Patient taking differently: Take 20 mg by mouth at bedtime.)  . Polyethyl Glycol-Propyl Glycol  (SYSTANE OP) Place 1 drop into both eyes daily as needed (dry eyes).  . rivaroxaban (XARELTO) 20 MG TABS tablet TAKE 1 TABLET (20 MG TOTAL) BY MOUTH DAILY WITH SUPPER.  . traMADol (ULTRAM) 50 MG tablet Take 50 mg by mouth at bedtime.  . [DISCONTINUED] albuterol (PROVENTIL HFA;VENTOLIN HFA) 108 (90 Base) MCG/ACT inhaler Inhale 2 puffs into the lungs every 4 (four) hours as needed for wheezing or shortness of breath.     Allergies:   Patient has no known allergies.   Social History   Socioeconomic History  . Marital status: Married    Spouse name: Not on file  . Number of children: 2  . Years of education: Not on file  . Highest education level: Not on file  Occupational History  . Occupation: retired   Tobacco Use  . Smoking status: Never Smoker  . Smokeless tobacco: Never Used  Substance and Sexual Activity  . Alcohol use: No    Alcohol/week: 0.0 standard drinks  . Drug use: No  . Sexual activity: Not on file  Other Topics Concern  . Not on file  Social History Narrative  . Not on file   Social Determinants of Health   Financial Resource Strain: Not on file  Food Insecurity: Not on file  Transportation Needs: Not on file  Physical Activity: Not on file  Stress: Not on file  Social Connections: Not on file     Family History: The patient's family history includes Diabetes in her brother, maternal grandmother, and mother; Heart attack in her mother; Transient ischemic attack in her mother. There is no history of Colon cancer, Esophageal cancer, or Stomach cancer.  ROS:   Please see the history of present illness.    All other systems are reviewed and are negative.   EKGs/Labs/Other Studies Reviewed:    The following studies were reviewed today: Venous duplex ultrasound June 19  EKG:  EKG is ordered today.  The ekg ordered today demonstrates normal sinus rhythm with mild delayed anterior R wave progression, otherwise normal, QTC 428 ms  Recent Labs: 09/20/2020:  BNP WILL FOLLOW; Hemoglobin 13.8; Platelets 361; Sodium 141  Recent Lipid Panel No results found for: CHOL, TRIG, HDL, CHOLHDL, VLDL, LDLCALC, LDLDIRECT  Physical Exam:    VS:  BP 140/80   Pulse 100   Ht '5\' 2"'  (1.575 m)   Wt 190 lb 9.6 oz (86.5 kg)   BMI 34.86 kg/m     Wt Readings from Last 3 Encounters:  09/20/20 190 lb 9.6 oz (86.5 kg)  08/04/19 203 lb (92.1 kg)  07/02/18 200 lb (90.7 kg)     General: Alert, oriented  x3, no distress, moderately obese Head: no evidence of trauma, PERRL, EOMI, no exophtalmos or lid lag, no myxedema, no xanthelasma; normal ears, nose and oropharynx Neck: normal jugular venous pulsations and no hepatojugular reflux; brisk carotid pulses without delay and no carotid bruits Chest: clear to auscultation, no signs of consolidation by percussion or palpation, normal fremitus, symmetrical and full respiratory excursions Cardiovascular: normal position and quality of the apical impulse, regular rhythm, normal first and second heart sounds, no murmurs, rubs or gallops Abdomen: no tenderness or distention, no masses by palpation, no abnormal pulsatility or arterial bruits, normal bowel sounds, no hepatosplenomegaly Extremities: no clubbing, cyanosis or edema; 2+ radial, ulnar and brachial pulses bilaterally; 2+ right femoral, posterior tibial and dorsalis pedis pulses; 2+ left femoral, posterior tibial and dorsalis pedis pulses; no subclavian or femoral bruits Neurological: grossly nonfocal Psych: Normal mood and affect    ASSESSMENT:    1. Shortness of breath   2. Chest pain of uncertain etiology   3. History of DVT (deep vein thrombosis)   4. Long term (current) use of anticoagulants   5. Peripheral venous insufficiency   6. Rheumatoid arthritis involving multiple sites, unspecified whether rheumatoid factor present ([place])    PLAN:    In order of problems listed above:  1. Dyspnea at rest/chest tightness: She describes both chest pressure as well as  a different kind of discomfort that sounds more pleuritic.  She feels short of breath with minimal activity without a clear-cut pattern orthopnea.  Her edema is no worse than before and is symmetrical.  She has resting sinus tachycardia.  The symptoms began toward the end of a long car ride.  Even if she has been compliant with her Xarelto, my first concern remains that she has a new pulmonary embolism.  Differential diagnosis would be with pneumonia/viral infection.  I reinforced the need to be compliant with her Xarelto.  Will order a chest x-ray, D-dimer, BNP and CBC as well as a ESR.  If the D-dimer is high she will need a CT angiogram of the chest and if pulmonary embolism is confirmed then I would recommend hospitalization, see if she needs to switch to a different anticoagulation regimen. 2. History of DVT: She has some mild 2+ nonpitting edema of both ankles that is symmetrical there is no discoloration or tenderness in either leg. 3. Xarelto: She has been compliant with this medication and always takes it with food.  Other than easy bruising she has no complaints.  Hemoglobin was 14.4 on 05/16/2020.   4. Peripheral venous insufficiency: Predates the DVT by many years and is likely an inherited trait.  Commended compression stockings during the day, leg elevation whenever possible and weight loss would be highly beneficial. 5. RA: She does not take NSAIDs.  On Remicade.  Methotrexate has been stopped.    Medication Adjustments/Labs and Tests Ordered: Current medicines are reviewed at length with the patient today.  Concerns regarding medicines are outlined above.  Orders Placed This Encounter  Procedures  . DG Chest 2 View  . Basic metabolic panel  . Brain natriuretic peptide  . D-dimer, quantitative  . CBC  . Sedimentation rate  . EKG 12-Lead  . ECHOCARDIOGRAM COMPLETE   No orders of the defined types were placed in this encounter.   Patient Instructions  Medication Instructions:   No changes *If you need a refill on your cardiac medications before your next appointment, please call your pharmacy*   Lab Work: Your provider would  like for you to have the following labs today: CBC, Sed Rate, BNP, BMET, and D-Dimer  If you have labs (blood work) drawn today and your tests are completely normal, you will receive your results only by: Marland Kitchen MyChart Message (if you have MyChart) OR . A paper copy in the mail If you have any lab test that is abnormal or we need to change your treatment, we will call you to review the results.   Testing/Procedures: A chest x-ray takes a picture of the organs and structures inside the chest, including the heart, lungs, and blood vessels. This test can show several things, including, whether the heart is enlarges; whether fluid is building up in the lungs; and whether pacemaker / defibrillator leads are still in place. This can be done at [facility]. A map has been given to you.   Your physician has requested that you have an echocardiogram. Echocardiography is a painless test that uses sound waves to create images of your heart. It provides your doctor with information about the size and shape of your heart and how well your heart's chambers and valves are working. You may receive an ultrasound enhancing agent through an IV if needed to better visualize your heart during the echo.This procedure takes approximately one hour. There are no restrictions for this procedure. This will take place at the [address].    Follow-Up: At [facility], you and your health needs are our priority.  As part of our continuing mission to provide you with exceptional heart care, we have created designated Provider Care Teams.  These Care Teams include your primary Cardiologist (physician) and Advanced Practice Providers (APPs -  Physician Assistants and Nurse Practitioners) who all work together to provide you with the care you need, when you  need it.  We recommend signing up for the patient portal called "MyChart".  Sign up information is provided on this After Visit Summary.  MyChart is used to connect with patients for Virtual Visits (Telemedicine).  Patients are able to view lab/test results, encounter notes, upcoming appointments, etc.  Non-urgent messages can be sent to your provider as well.   To learn more about what you can do with MyChart, go to [email or web address].    Your next appointment:   8 week(s)  The format for your next appointment:   In Person  Provider:   You may see Sanda Klein, MD or one of the following Advanced Practice Providers on your designated Care Team:    Almyra Deforest, PA-C  Fabian Sharp, [place] or   Roby Lofts, PA-C      Signed, Sanda Klein, MD  09/21/2020 11:58 AM    [place]

## 2020-09-20 NOTE — Patient Instructions (Signed)
Medication Instructions:  No changes *If you need a refill on your cardiac medications before your next appointment, please call your pharmacy*   Lab Work: Your provider would like for you to have the following labs today: CBC, Sed Rate, BNP, BMET, and D-Dimer  If you have labs (blood work) drawn today and your tests are completely normal, you will receive your results only by: Marland Kitchen MyChart Message (if you have MyChart) OR . A paper copy in the mail If you have any lab test that is abnormal or we need to change your treatment, we will call you to review the results.   Testing/Procedures: A chest x-ray takes a picture of the organs and structures inside the chest, including the heart, lungs, and blood vessels. This test can show several things, including, whether the heart is enlarges; whether fluid is building up in the lungs; and whether pacemaker / defibrillator leads are still in place. This can be done at [facility]. A map has been given to you.   Your physician has requested that you have an echocardiogram. Echocardiography is a painless test that uses sound waves to create images of your heart. It provides your doctor with information about the size and shape of your heart and how well your heart's chambers and valves are working. You may receive an ultrasound enhancing agent through an IV if needed to better visualize your heart during the echo.This procedure takes approximately one hour. There are no restrictions for this procedure. This will take place at the [address], [address].    Follow-Up: At [facility], you and your health needs are our priority.  As part of our continuing mission to provide you with exceptional heart care, we have created designated Provider Care Teams.  These Care Teams include your primary Cardiologist (physician) and Advanced Practice Providers (APPs -  Physician Assistants and Nurse Practitioners) who all work together to provide you with  the care you need, when you need it.  We recommend signing up for the patient portal called "MyChart".  Sign up information is provided on this After Visit Summary.  MyChart is used to connect with patients for Virtual Visits (Telemedicine).  Patients are able to view lab/test results, encounter notes, upcoming appointments, etc.  Non-urgent messages can be sent to your provider as well.   To learn more about what you can do with MyChart, go to [email or web address].    Your next appointment:   8 week(s)  The format for your next appointment:   In Person  Provider:   You may see Sanda Klein, MD or one of the following Advanced Practice Providers on your designated Care Team:    Almyra Deforest, PA-C  Fabian Sharp, PA-C or   Roby Lofts, [place]

## 2020-09-21 ENCOUNTER — Ambulatory Visit
Admission: RE | Admit: 2020-09-21 | Discharge: 2020-09-21 | Disposition: A | Discharge status: Home / Self Care | Payer: Medicare Other | Source: Ambulatory Visit | Attending: Cardiovascular Disease | Admitting: Cardiovascular Disease

## 2020-09-21 DIAGNOSIS — R0602 Shortness of breath: Secondary | ICD-10-CM

## 2020-09-21 DIAGNOSIS — Z86718 Personal history of other venous thrombosis and embolism: Secondary | ICD-10-CM

## 2020-09-21 LAB — BRAIN NATRIURETIC PEPTIDE: BNP: 17.2 pg/mL (ref 0.0–100.0)

## 2020-09-21 LAB — CBC
Hematocrit: 41.9 % (ref 34.0–46.6)
Hemoglobin: 13.8 g/dL (ref 11.1–15.9)
MCH: 29.9 pg (ref 26.6–33.0)
MCHC: 32.9 g/dL (ref 31.5–35.7)
MCV: 91 fL (ref 79–97)
Platelets: 361 10*3/uL (ref 150–450)
RBC: 4.62 x10E6/uL (ref 3.77–5.28)
RDW: 12 % (ref 11.7–15.4)
WBC: 8 10*3/uL (ref 3.4–10.8)

## 2020-09-21 LAB — BASIC METABOLIC PANEL
BUN/Creatinine Ratio: 20 (ref 12–28)
BUN: 14 mg/dL (ref 8–27)
CO2: 22 mmol/L (ref 20–29)
Calcium: 8.6 mg/dL — ABNORMAL LOW (ref 8.7–10.3)
Chloride: 101 mmol/L (ref 96–106)
Creatinine, Ser: 0.7 mg/dL (ref 0.57–1.00)
Glucose: 111 mg/dL — ABNORMAL HIGH (ref 65–99)
Potassium: 4 mmol/L (ref 3.5–5.2)
Sodium: 141 mmol/L (ref 134–144)
eGFR: 90 mL/min/{1.73_m2} (ref >59)

## 2020-09-21 LAB — SEDIMENTATION RATE: Sed Rate: 74 mm/hr — ABNORMAL HIGH (ref 0–40)

## 2020-09-21 LAB — D-DIMER, QUANTITATIVE: D-DIMER: 4.39 mg/L FEU — ABNORMAL HIGH (ref 0.00–0.49)

## 2020-09-22 ENCOUNTER — Telehealth: Payer: Self-pay | Admitting: Cardiovascular Disease

## 2020-09-22 ENCOUNTER — Other Ambulatory Visit: Payer: Self-pay | Admitting: *Deleted

## 2020-09-22 ENCOUNTER — Ambulatory Visit
Admission: RE | Admit: 2020-09-22 | Discharge: 2020-09-22 | Disposition: A | Discharge status: Home / Self Care | Payer: Medicare Other | Source: Ambulatory Visit | Attending: Cardiovascular Disease | Admitting: Cardiovascular Disease

## 2020-09-22 DIAGNOSIS — R0602 Shortness of breath: Secondary | ICD-10-CM

## 2020-09-22 DIAGNOSIS — Z86718 Personal history of other venous thrombosis and embolism: Secondary | ICD-10-CM

## 2020-09-22 MED ORDER — IOPAMIDOL (ISOVUE-370) INJECTION 76%
75.0000 mL | Freq: Once | INTRAVENOUS | Status: AC | PRN
  Administered 2020-09-22: 75 mL via INTRAVENOUS

## 2020-09-22 MED ORDER — ALBUTEROL SULFATE HFA 108 (90 BASE) MCG/ACT IN AERS: 2.0000 | INHALATION_SPRAY | Freq: Four times a day (QID) | RESPIRATORY_TRACT | 0 refills | Status: DC | PRN

## 2020-09-22 NOTE — Addendum Note (Signed)
Addended by: Ricci Barker on: 09/22/2020 08:45 AM   Modules accepted: Orders

## 2020-09-22 NOTE — Telephone Encounter (Signed)
Spoke with patient regarding stat CTA chest---PE protocol ordered by Dr. Malva Limes to arrive at [facility] no later than 1:00pm for study.  Patient voiced her understanding.

## 2020-09-22 NOTE — Addendum Note (Signed)
Addended by: Ricci Barker on: 09/22/2020 10:01 AM   Modules accepted: Orders

## 2020-10-23 ENCOUNTER — Ambulatory Visit (HOSPITAL_COMMUNITY): Payer: Medicare Other | Attending: Cardiovascular Disease

## 2020-10-23 ENCOUNTER — Other Ambulatory Visit: Payer: Self-pay

## 2020-10-23 ENCOUNTER — Encounter (INDEPENDENT_AMBULATORY_CARE_PROVIDER_SITE_OTHER): Payer: Self-pay

## 2020-10-23 DIAGNOSIS — Z86718 Personal history of other venous thrombosis and embolism: Secondary | ICD-10-CM | POA: Insufficient documentation

## 2020-10-23 DIAGNOSIS — R0602 Shortness of breath: Secondary | ICD-10-CM | POA: Insufficient documentation

## 2020-10-23 LAB — ECHOCARDIOGRAM COMPLETE
Area-P 1/2: 3.91 cm2
S' Lateral: 2.2 cm

## 2020-11-17 ENCOUNTER — Ambulatory Visit (INDEPENDENT_AMBULATORY_CARE_PROVIDER_SITE_OTHER): Payer: Medicare Other | Admitting: Cardiovascular Disease

## 2020-11-17 ENCOUNTER — Encounter: Payer: Self-pay | Admitting: Cardiovascular Disease

## 2020-11-17 ENCOUNTER — Other Ambulatory Visit: Payer: Self-pay

## 2020-11-17 VITALS — BP 122/70 | HR 90 | Ht 64.0 in | Wt 190.0 lb

## 2020-11-17 DIAGNOSIS — Z86718 Personal history of other venous thrombosis and embolism: Secondary | ICD-10-CM | POA: Diagnosis not present

## 2020-11-17 DIAGNOSIS — I700 Atherosclerosis of aorta: Secondary | ICD-10-CM | POA: Diagnosis not present

## 2020-11-17 DIAGNOSIS — N390 Urinary tract infection, site not specified: Secondary | ICD-10-CM

## 2020-11-17 DIAGNOSIS — I872 Venous insufficiency (chronic) (peripheral): Secondary | ICD-10-CM

## 2020-11-17 DIAGNOSIS — R079 Chest pain, unspecified: Secondary | ICD-10-CM

## 2020-11-17 DIAGNOSIS — Z7901 Long term (current) use of anticoagulants: Secondary | ICD-10-CM

## 2020-11-17 DIAGNOSIS — M069 Rheumatoid arthritis, unspecified: Secondary | ICD-10-CM

## 2020-11-17 MED ORDER — METOPROLOL SUCCINATE ER 25 MG PO TB24: 25.0000 mg | ORAL_TABLET | Freq: Every day | ORAL | 2 refills | Status: DC

## 2020-11-17 NOTE — Progress Notes (Signed)
Cardiology Office Note:    Date:  11/17/2020   ID:  Phyllis Austin, DOB Apr 15, 1946, MRN [id number]  PCP:  Pcp, No  Cardiologist:  Sanda Klein, MD   Referring MD: Sanjuana Kava, MD   No chief complaint on file.   History of Present Illness:    Phyllis Austin is a 75 y.o. 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  She has been compliant with anticoagulation with Xarelto since then.  She also has rheumatoid arthritis and plaque psoriasis and is on chronic treatment with Remicade.  She continues to feel poorly ever since her episode of severe chest discomfort that occurred during a drive back home from [facility] on March 28.  We performed a CT angiogram of the chest that did not show any evidence of pulmonary embolism, although did show significant atherosclerosis in the aortic arch and a chronically elevated right hemidiaphragm.  There was evidence of thoracic spine degenerative changes, but there was no compression fracture.  Normal.  She also underwent an echocardiogram that showed normal regional wall motion and normal LVEF on Oct 23, 2020.  The BNP was very low at only 17.  Interestingly, her sed rate was elevated at 74.  She continues to experience dyspnea with light activity, even making the bed, although she was able to put down some beds of tomato plants this week.  She describes a pressure in her chest she describes by motioning with both arms dragging her thumbs from her spine across the ninth intercostal space all the way to her epigastric area.  She has chronic mild-moderate calf swelling in a symmetrical pattern.  She is compliant with Xarelto anticoagulant therapy and always takes the medication with a meal.  He does not have orthopnea, PND, palpitations, dizziness, syncope or pleuritic symptoms.  She has also had problems with frequent urination and incontinence and malodorous urine that has not resolved after  several courses of oral antibiotics.  She took the antibiotics to the point that she developed thrush and groin yeast infections for which she is now receiving fluconazole.  She is on chronic Remicade therapy for the last 10 years or so, followed by Dr. Amil Amen.  She has a family history of premature onset CAD: Her mother had a myocardial infarction at age 89 and died of a second infarction at age 88, her maternal grandmother died of a myocardial infarction at the age of 45.    Past Medical History:  Diagnosis Date  . Allergy   . Cataract    surgery to remove  . Diverticulosis   . Gallstones   . GERD (gastroesophageal reflux disease)   . Glaucoma    no med - just watching  . Hepatitis    as a child  . Hiatal hernia   . Osteoarthritis   . Psoriatic arthritis ([place])   . RA (rheumatoid arthritis) ([place])     Past Surgical History:  Procedure Laterality Date  . BUNIONECTOMY Right    with correction  . CESAREAN SECTION  10/21/79, 03/28/81   x 2  . CHOLECYSTECTOMY N/A 04/12/2015   Procedure: LAPAROSCOPIC CHOLECYSTECTOMY;  Surgeon: [name], MD;  Location: [facility];  Service: General;  Laterality: N/A;  . COLONOSCOPY  4/20006   [facility] I[facility]  . DILATION AND CURETTAGE OF UTERUS    . EYE SURGERY Bilateral    cataract surgery w/ lens implant  . INTRAOCULAR LENS INSERTION Bilateral   . TIBIA FRACTURE  SURGERY Right 1986   Bone transplant  . WISDOM TOOTH EXTRACTION      Current Medications: Current Meds  Medication Sig  . acetaminophen (TYLENOL) 650 MG CR tablet Take 1,300 mg by mouth every 8 (eight) hours as needed for pain.  Marland Kitchen albuterol (VENTOLIN HFA) 108 (90 Base) MCG/ACT inhaler Inhale 2 puffs into the lungs every 6 (six) hours as needed for wheezing or shortness of breath.  . Biotin 5 MG TABS Take 1 tablet by mouth at bedtime.  . cholestyramine (QUESTRAN) 4 g packet Take 1 packet (4 g total) by mouth 2 (two) times daily.  . Doxylamine Succinate, Sleep, (SLEEP AID PO) Take 1  tablet by mouth at bedtime.  . InFLIXimab (REMICADE IV) Inject into the vein See admin instructions. Every 8 weeks at Dr. [name] office  . metoprolol succinate (TOPROL-XL) 25 MG 24 hr tablet Take 1 tablet (25 mg total) by mouth daily.  . pantoprazole (PROTONIX) 20 MG tablet Take 1 tablet (20 mg total) by mouth daily.  Vladimir Faster Glycol-Propyl Glycol (SYSTANE OP) Place 1 drop into both eyes daily as needed (dry eyes).  . rivaroxaban (XARELTO) 20 MG TABS tablet TAKE 1 TABLET (20 MG TOTAL) BY MOUTH DAILY WITH SUPPER.  . traMADol (ULTRAM) 50 MG tablet Take 50 mg by mouth at bedtime.     Allergies:   Methotrexate derivatives   Social History   Socioeconomic History  . Marital status: Married    Spouse name: Not on file  . Number of children: 2  . Years of education: Not on file  . Highest education level: Not on file  Occupational History  . Occupation: retired   Tobacco Use  . Smoking status: Never Smoker  . Smokeless tobacco: Never Used  Substance and Sexual Activity  . Alcohol use: No    Alcohol/week: 0.0 standard drinks  . Drug use: No  . Sexual activity: Not on file  Other Topics Concern  . Not on file  Social History Narrative  . Not on file   Social Determinants of Health   Financial Resource Strain: Not on file  Food Insecurity: Not on file  Transportation Needs: Not on file  Physical Activity: Not on file  Stress: Not on file  Social Connections: Not on file     Family History: The patient's family history includes Diabetes in her brother, maternal grandmother, and mother; Heart attack in her mother; Transient ischemic attack in her mother. There is no history of Colon cancer, Esophageal cancer, or Stomach cancer.  ROS:   Please see the history of present illness.    All other systems are reviewed and are negative.   EKGs/Labs/Other Studies Reviewed:    The following studies were reviewed today: Venous duplex ultrasound June 19  EKG:  EKG is ordered  today.  The ekg ordered today demonstrates normal sinus rhythm with mild delayed anterior R wave progression, otherwise normal, QTC 428 ms  Recent Labs: 09/20/2020: BNP 17.2; BUN 14; Creatinine, Ser 0.70; Hemoglobin 13.8; Platelets 361; Potassium 4.0; Sodium 141  Recent Lipid Panel No results found for: CHOL, TRIG, HDL, CHOLHDL, VLDL, LDLCALC, LDLDIRECT  Physical Exam:    VS:  BP 122/70 (BP Location: Left Arm, Patient Position: Sitting, Cuff Size: Normal)   Pulse 90   Ht _0  (1.626 m)   Wt 190 lb (86.2 kg)   BMI 32.61 kg/m     Wt Readings from Last 3 Encounters:  11/17/20 190 lb (86.2 kg)  09/20/20 190 lb  9.6 oz (86.5 kg)  08/04/19 203 lb (92.1 kg)     General: Alert, oriented x3, no distress, mildly obese Head: no evidence of trauma, PERRL, EOMI, no exophtalmos or lid lag, no myxedema, no xanthelasma; normal ears, nose and oropharynx Neck: normal jugular venous pulsations and no hepatojugular reflux; brisk carotid pulses without delay and no carotid bruits Chest: clear to auscultation, no signs of consolidation by percussion or palpation, normal fremitus, symmetrical and full respiratory excursions Cardiovascular: normal position and quality of the apical impulse, regular rhythm, normal first and second heart sounds, no murmurs, rubs or gallops Abdomen: no tenderness or distention, no masses by palpation, no abnormal pulsatility or arterial bruits, normal bowel sounds, no hepatosplenomegaly Extremities: no clubbing, cyanosis or edema; 2+ radial, ulnar and brachial pulses bilaterally; 2+ right femoral, posterior tibial and dorsalis pedis pulses; 2+ left femoral, posterior tibial and dorsalis pedis pulses; no subclavian or femoral bruits Neurological: grossly nonfocal Psych: Normal mood and affect     ASSESSMENT:    1. Chest pain, unspecified type   2. Atherosclerosis of aorta ([place])   3. History of DVT (deep vein thrombosis)   4. Long term (current) use of anticoagulants   5.  Peripheral venous insufficiency   6. Rheumatoid arthritis involving multiple sites, unspecified whether rheumatoid factor present ([place])   7. UTI (urinary tract infection), uncomplicated    PLAN:    In order of problems listed above:  1. Dyspnea/chest tightness: Compared to her previous office visit her complaints of dyspnea and chest tightness have more of an exertional pattern today, although she still continues to have symptoms at rest as well.  Her EKG, BNP and echocardiogram did not support a diagnosis of acute myocardial infarction or congestive heart failure.  Both her D-dimer and ESR were elevated, but the CT angiography did not show pulmonary embolism, lung infection or compression fracture.  Her EKG and echo did not show evidence of ischemic abnormalities.  She does have prominent aortic atherosclerosis and could have coronary insufficiency.  We will schedule her for a Lexiscan Myoview.  Start metoprolol succinate 25 mg once daily.  Note that she has not required use of her albuterol inhaler in years. 2. Aortic atherosclerosis: Need to check a lipid profile.  There are no recent labs available. 3. History of DVT: On lifelong anticoagulation.  She has some mild 2+ nonpitting edema of both ankles that is symmetrical there is no discoloration or tenderness in either leg. 4. Xarelto: None, no bleeding complications. 5. Peripheral venous insufficiency: Predates the DVT by many years and is likely an inherited trait.  Commended compression stockings during the day, leg elevation whenever possible and weight loss would be highly beneficial. 6. RA: She does not take NSAIDs.  On Remicade.  This may explain her elevated ESR. 7. UTI: Full antibacterial medications from the amount, we will check a urine culture.  She is on fluconazole for fungal infections that are probably a complication of prolonged antibacterial therapy.    Medication Adjustments/Labs and Tests Ordered: Current medicines are  reviewed at length with the patient today.  Concerns regarding medicines are outlined above.  Orders Placed This Encounter  Procedures  . Urine Culture  . Lipid panel  . Myocardial Perfusion Imaging   Meds ordered this encounter  Medications  . metoprolol succinate (TOPROL-XL) 25 MG 24 hr tablet    Sig: Take 1 tablet (25 mg total) by mouth daily.    Dispense:  30 tablet    Refill:  2    Patient Instructions  Medication Instructions:  START Metoprolol Succinate 25 mg once daily  *If you need a refill on your cardiac medications before your next appointment, please call your pharmacy*   Lab Work: Your provider would like for you to have the following labs today: Urine Culture and fasting lipid  If you have labs (blood work) drawn today and your tests are completely normal, you will receive your results only by: Marland Kitchen MyChart Message (if you have MyChart) OR . A paper copy in the mail If you have any lab test that is abnormal or we need to change your treatment, we will call you to review the results.   Testing/Procedures: Your physician has requested that you have a lexiscan myoview. For further information please visit [email or web address]. Please follow instruction sheet, as given.  How to prepare for your Myocardial Perfusion Test:  Do not eat or drink 3 hours prior to your test, except you may have water.  Do not consume products containing caffeine (regular or decaffeinated) 12 hours prior to your test. (ex: coffee, chocolate, sodas, tea).  Do bring a list of your current medications with you.  If not listed below, you may take your medications as normal.  Do wear comfortable clothes (no dresses or overalls) and walking shoes, tennis shoes preferred (No heels or open toe shoes are allowed).  Do NOT wear cologne, perfume, aftershave, or lotions (deodorant is allowed).  The test will take approximately 3 to 4 hours to complete  If these instructions are not followed, your  test will have to be rescheduled.  Follow-Up: At [facility], you and your health needs are our priority.  As part of our continuing mission to provide you with exceptional heart care, we have created designated Provider Care Teams.  These Care Teams include your primary Cardiologist (physician) and Advanced Practice Providers (APPs -  Physician Assistants and Nurse Practitioners) who all work together to provide you with the care you need, when you need it.  We recommend signing up for the patient portal called "MyChart".  Sign up information is provided on this After Visit Summary.  MyChart is used to connect with patients for Virtual Visits (Telemedicine).  Patients are able to view lab/test results, encounter notes, upcoming appointments, etc.  Non-urgent messages can be sent to your provider as well.   To learn more about what you can do with MyChart, go to [email or web address].    Your next appointment:   1 month(s)  The format for your next appointment:   In Person  Provider:   You will see one of the following Advanced Practice Providers on your designated Care Team:    Almyra Deforest, PA-C  Fabian Sharp, PA-C or   Roby Lofts, PA-C      Signed, Sanda Klein, MD  11/17/2020 2:42 PM    [place]

## 2020-11-17 NOTE — Patient Instructions (Addendum)
Medication Instructions:  START Metoprolol Succinate 25 mg once daily  *If you need a refill on your cardiac medications before your next appointment, please call your pharmacy*   Lab Work: Your provider would like for you to have the following labs today: Urine Culture and fasting lipid  If you have labs (blood work) drawn today and your tests are completely normal, you will receive your results only by: Marland Kitchen MyChart Message (if you have MyChart) OR . A paper copy in the mail If you have any lab test that is abnormal or we need to change your treatment, we will call you to review the results.   Testing/Procedures: Your physician has requested that you have a lexiscan myoview. For further information please visit [email or web address]. Please follow instruction sheet, as given.  How to prepare for your Myocardial Perfusion Test:  Do not eat or drink 3 hours prior to your test, except you may have water.  Do not consume products containing caffeine (regular or decaffeinated) 12 hours prior to your test. (ex: coffee, chocolate, sodas, tea).  Do bring a list of your current medications with you.  If not listed below, you may take your medications as normal.  Do wear comfortable clothes (no dresses or overalls) and walking shoes, tennis shoes preferred (No heels or open toe shoes are allowed).  Do NOT wear cologne, perfume, aftershave, or lotions (deodorant is allowed).  The test will take approximately 3 to 4 hours to complete  If these instructions are not followed, your test will have to be rescheduled.  Follow-Up: At [facility], you and your health needs are our priority.  As part of our continuing mission to provide you with exceptional heart care, we have created designated Provider Care Teams.  These Care Teams include your primary Cardiologist (physician) and Advanced Practice Providers (APPs -  Physician Assistants and Nurse Practitioners) who all work together to provide you  with the care you need, when you need it.  We recommend signing up for the patient portal called "MyChart".  Sign up information is provided on this After Visit Summary.  MyChart is used to connect with patients for Virtual Visits (Telemedicine).  Patients are able to view lab/test results, encounter notes, upcoming appointments, etc.  Non-urgent messages can be sent to your provider as well.   To learn more about what you can do with MyChart, go to [email or web address].    Your next appointment:   1 month(s)  The format for your next appointment:   In Person  Provider:   You will see one of the following Advanced Practice Providers on your designated Care Team:    Almyra Deforest, PA-C  Fabian Sharp, PA-C or   Roby Lofts, [place]

## 2020-11-29 ENCOUNTER — Telehealth (HOSPITAL_COMMUNITY): Payer: Self-pay | Admitting: *Deleted

## 2020-11-29 NOTE — Telephone Encounter (Signed)
Close encounter 

## 2020-11-30 ENCOUNTER — Other Ambulatory Visit: Payer: Self-pay

## 2020-11-30 ENCOUNTER — Ambulatory Visit (HOSPITAL_COMMUNITY)
Admission: RE | Admit: 2020-11-30 | Discharge: 2020-11-30 | Disposition: A | Discharge status: Home / Self Care | Payer: Medicare Other | Source: Ambulatory Visit | Attending: Cardiovascular Disease | Admitting: Cardiovascular Disease

## 2020-11-30 DIAGNOSIS — R079 Chest pain, unspecified: Secondary | ICD-10-CM | POA: Diagnosis present

## 2020-11-30 LAB — MYOCARDIAL PERFUSION IMAGING
LV dias vol: 95 mL (ref 46–106)
LV sys vol: 45 mL
Peak HR: 100 {beats}/min
Rest HR: 64 {beats}/min
SDS: 7
SRS: 0
SSS: 7
TID: 1.17

## 2020-11-30 MED ORDER — REGADENOSON 0.4 MG/5ML IV SOLN
0.4000 mg | Freq: Once | INTRAVENOUS | Status: AC
  Administered 2020-11-30: 0.4 mg via INTRAVENOUS

## 2020-11-30 MED ORDER — TECHNETIUM TC 99M TETROFOSMIN IV KIT
28.7000 | PACK | Freq: Once | INTRAVENOUS | Status: AC | PRN
  Administered 2020-11-30: 28.7 via INTRAVENOUS
  Filled 2020-11-30: qty 29

## 2020-11-30 MED ORDER — TECHNETIUM TC 99M TETROFOSMIN IV KIT
9.1000 | PACK | Freq: Once | INTRAVENOUS | Status: AC | PRN
  Administered 2020-11-30: 9.1 via INTRAVENOUS
  Filled 2020-11-30: qty 10

## 2020-12-01 ENCOUNTER — Encounter: Payer: Self-pay | Admitting: *Deleted

## 2021-01-03 ENCOUNTER — Ambulatory Visit (INDEPENDENT_AMBULATORY_CARE_PROVIDER_SITE_OTHER): Payer: Medicare Other | Admitting: Physician Assistant

## 2021-01-03 ENCOUNTER — Encounter: Payer: Self-pay | Admitting: Physician Assistant

## 2021-01-03 ENCOUNTER — Other Ambulatory Visit: Payer: Self-pay

## 2021-01-03 VITALS — BP 140/80 | HR 101 | Ht 63.0 in | Wt 192.8 lb

## 2021-01-03 DIAGNOSIS — R079 Chest pain, unspecified: Secondary | ICD-10-CM

## 2021-01-03 DIAGNOSIS — Z86718 Personal history of other venous thrombosis and embolism: Secondary | ICD-10-CM

## 2021-01-03 MED ORDER — METOPROLOL SUCCINATE ER 25 MG PO TB24: 25.0000 mg | ORAL_TABLET | Freq: Every day | ORAL | 1 refills | Status: DC

## 2021-01-03 MED ORDER — METOPROLOL SUCCINATE ER 25 MG PO TB24: 25.0000 mg | ORAL_TABLET | Freq: Every day | ORAL | 5 refills | Status: DC

## 2021-01-03 NOTE — Progress Notes (Signed)
Cardiology Office Note:    Date:  01/06/2021   ID:  Phyllis Austin, DOB Oct 30, 1945, MRN [id number]  PCP:  Phyllis Austin, No   [facility] Providers Cardiologist:  Phyllis Klein, MD     Referring MD: No ref. provider found   Chief Complaint  Patient presents with   Follow-up    Seen for Dr. Sallyanne Austin    History of Present Illness:    Phyllis Austin is a 75 y.o. female with a hx of unprovoked DVT of LLE 2018 for which she was placed on Xarelto.  She also has rheumatoid arthritis and plaque psoriasis and on chronic treatment with Remicade.  She had significant chest pain while driving home from [facility] in March.  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  There was also thoracic spine degenerative changes but no compression fracture.  Echocardiogram obtained in May 2022 showed normal EF, no significant regional wall motion abnormality.  She was last seen by Dr. Sallyanne Austin on 11/17/2020, at which time she continued to experience dyspnea with light activity.  She was started on metoprolol succinate 25 mg daily.  Myoview obtained on 11/30/2020 showed EF 53%, no ischemia or infarction on the perfusion images, normal wall motion.  Patient presents today for follow-up.  I reviewed the recent Myoview result and echocardiogram results with the patient.  His dyspnea on exertion has been improving.  Heart rate is still borderline elevated, blood pressure still elevated at 140/86 on repeat manual recheck by myself.  However it was normal during the last visit.  I decided to hold off on up titration of metoprolol succinate.  At this point, I would not recommend any further work-up, she can follow-up with Dr. Sallyanne Austin in 6 months.  Past Medical History:  Diagnosis Date   Allergy    Cataract    surgery to remove   Diverticulosis    Gallstones    GERD (gastroesophageal reflux disease)    Glaucoma    no med -  just watching   Hepatitis    as a child   Hiatal hernia    Osteoarthritis    Psoriatic arthritis ([place])    RA (rheumatoid arthritis) ([name])     Past Surgical History:  Procedure Laterality Date   BUNIONECTOMY Right    with correction   CESAREAN SECTION  10/21/79, 03/28/81   x 2   CHOLECYSTECTOMY N/A 04/12/2015   Procedure: LAPAROSCOPIC CHOLECYSTECTOMY;  Surgeon: Phyllis Ok, MD;  Location: [place];  Service: General;  Laterality: N/A;   COLONOSCOPY  4/20006   Phyllis Austin   DILATION AND CURETTAGE OF UTERUS     EYE SURGERY Bilateral    cataract surgery w/ lens implant   INTRAOCULAR LENS INSERTION Bilateral    TIBIA FRACTURE SURGERY Right 1986   Bone transplant   WISDOM TOOTH EXTRACTION      Current Medications: Current Meds  Medication Sig   acetaminophen (TYLENOL) 650 MG CR tablet Take 1,300 mg by mouth every 8 (eight) hours as needed for pain.   albuterol (VENTOLIN HFA) 108 (90 Base) MCG/ACT inhaler Inhale 2 puffs into the lungs every 6 (six) hours as needed for wheezing or shortness of breath.   Biotin 5 MG TABS Take 1 tablet by mouth at bedtime.   calcium acetate (PHOSLO) 667 MG capsule Take 667 mg by mouth at bedtime.   cholestyramine (QUESTRAN) 4 g packet Take 1 packet (4 g total) by  mouth 2 (two) times daily.   Doxylamine Succinate, Sleep, (SLEEP AID PO) Take 1 tablet by mouth at bedtime.   InFLIXimab (REMICADE IV) Inject into the vein See admin instructions. Every 8 weeks at Dr. [name] office   pantoprazole (PROTONIX) 20 MG tablet Take 1 tablet (20 mg total) by mouth daily.   Polyethyl Glycol-Propyl Glycol (SYSTANE OP) Place 1 drop into both eyes daily as needed (dry eyes).   rivaroxaban (XARELTO) 20 MG TABS tablet TAKE 1 TABLET (20 MG TOTAL) BY MOUTH DAILY WITH SUPPER.   traMADol (ULTRAM) 50 MG tablet Take 50 mg by mouth at bedtime.   [DISCONTINUED] metoprolol succinate (TOPROL-XL) 25 MG 24 hr tablet Take 1 tablet (25 mg total) by mouth daily.     Allergies:    Methotrexate derivatives   Social History   Socioeconomic History   Marital status: Married    Spouse name: Not on file   Number of children: 2   Years of education: Not on file   Highest education level: Not on file  Occupational History   Occupation: retired   Tobacco Use   Smoking status: Never   Smokeless tobacco: Never  Substance and Sexual Activity   Alcohol use: No    Alcohol/week: 0.0 standard drinks   Drug use: No   Sexual activity: Not on file  Other Topics Concern   Not on file  Social History Narrative   Not on file   Social Determinants of Health   Financial Resource Strain: Not on file  Food Insecurity: Not on file  Transportation Needs: Not on file  Physical Activity: Not on file  Stress: Not on file  Social Connections: Not on file     Family History: The patient's family history includes Diabetes in her brother, maternal grandmother, and mother; Heart attack in her mother; Transient ischemic attack in her mother. There is no history of Colon cancer, Esophageal cancer, or Stomach cancer.  ROS:   Please see the history of present illness.     All other systems reviewed and are negative.  EKGs/Labs/Other Studies Reviewed:    The following studies were reviewed today:  Echo 10/23/2020  1. Left ventricular ejection fraction, by estimation, is 55 to 60%. The  left ventricle has normal function. The left ventricle has no regional  wall motion abnormalities. Left ventricular diastolic parameters are  consistent with Grade I diastolic  dysfunction (impaired relaxation).   2. Right ventricular systolic function is normal. The right ventricular  size is normal. There is normal pulmonary artery systolic pressure.   3. Left atrial size was mildly dilated.   4. The mitral valve is normal in structure. Trivial mitral valve  regurgitation. No evidence of mitral stenosis.   5. The aortic valve is tricuspid. There is mild calcification of the  aortic valve.  There is mild thickening of the aortic valve. Aortic valve  regurgitation is not visualized. No aortic stenosis is present.   6. The inferior vena cava is normal in size with greater than 50%  respiratory variability, suggesting right atrial pressure of 3 mmHg.   EKG:  EKG is not ordered today.   Recent Labs: 09/20/2020: BNP 17.2; BUN 14; Creatinine, Ser 0.70; Hemoglobin 13.8; Platelets 361; Potassium 4.0; Sodium 141  Recent Lipid Panel No results found for: CHOL, TRIG, HDL, CHOLHDL, VLDL, LDLCALC, LDLDIRECT   Risk Assessment/Calculations:           Physical Exam:    VS:  BP 140/80 (BP Location: Right Arm)  Pulse (!) 101   Ht 5\' 3"  (1.6 m)   Wt 192 lb 12.8 oz (87.5 kg)   SpO2 97%   BMI 34.15 kg/m     Wt Readings from Last 3 Encounters:  01/03/21 192 lb 12.8 oz (87.5 kg)  11/30/20 190 lb (86.2 kg)  11/17/20 190 lb (86.2 kg)     GEN:  Well nourished, well developed in no acute distress HEENT: Normal NECK: No JVD; No carotid bruits LYMPHATICS: No lymphadenopathy CARDIAC: RRR, no murmurs, rubs, gallops RESPIRATORY:  Clear to auscultation without rales, wheezing or rhonchi  ABDOMEN: Soft, non-tender, non-distended MUSCULOSKELETAL:  No edema; No deformity  SKIN: Warm and dry NEUROLOGIC:  Alert and oriented x 3 PSYCHIATRIC:  Normal affect   ASSESSMENT:    1. Chest pain of uncertain etiology   2. History of DVT (deep vein thrombosis)    PLAN:    In order of problems listed above:  Chest pain: Recent Myoview and echocardiogram were reassuring.  No further work-up needed.  Tolerating the long-acting metoprolol.  We will send a refill to the pharmacy.  History of unprovoked DVT: On long-term Xarelto.        Medication Adjustments/Labs and Tests Ordered: Current medicines are reviewed at length with the patient today.  Concerns regarding medicines are outlined above.  No orders of the defined types were placed in this encounter.  Meds ordered this encounter   Medications   DISCONTD: metoprolol succinate (TOPROL-XL) 25 MG 24 hr tablet    Sig: Take 1 tablet (25 mg total) by mouth daily.    Dispense:  90 tablet    Refill:  1    Order Specific Question:   Supervising Provider    Answer:   Lelon Perla [[id number]]   metoprolol succinate (TOPROL-XL) 25 MG 24 hr tablet    Sig: Take 1 tablet (25 mg total) by mouth daily.    Dispense:  30 tablet    Refill:  5    Order Specific Question:   Supervising Provider    Answer:   Lelon Perla [[id number]]    Patient Instructions  Medication Instructions:  Your physician recommends that you continue on your current medications as directed. Please refer to the Current Medication list given to you today.  *If you need a refill on your cardiac medications before your next appointment, please call your pharmacy*  Lab Work: NONE ordered at this time of appointment   If you have labs (blood work) drawn today and your tests are completely normal, you will receive your results only by: [place] (if you have MyChart) OR A paper copy in the mail If you have any lab test that is abnormal or we need to change your treatment, we will call you to review the results.  Testing/Procedures: NONE ordered at this time of appointment   Follow-Up: At [facility], you and your health needs are our priority.  As part of our continuing mission to provide you with exceptional heart care, we have created designated Provider Care Teams.  These Care Teams include your primary Cardiologist (physician) and Advanced Practice Providers (APPs -  Physician Assistants and Nurse Practitioners) who all work together to provide you with the care you need, when you need it.  We recommend signing up for the patient portal called "MyChart".  Sign up information is provided on this After Visit Summary.  MyChart is used to connect with patients for Virtual Visits (Telemedicine).  Patients are able to view lab/test  results, encounter  notes, upcoming appointments, etc.  Non-urgent messages can be sent to your provider as well.   To learn more about what you can do with MyChart, go to [email or web address].    Your next appointment:   6 month(s)  The format for your next appointment:   In Person  Provider:   Sanda Klein, MD  Other Instructions    Signed, Almyra Deforest, [place]  01/06/2021 12:23 AM    [place]

## 2021-01-03 NOTE — Patient Instructions (Signed)
Medication Instructions:  Your physician recommends that you continue on your current medications as directed. Please refer to the Current Medication list given to you today.  *If you need a refill on your cardiac medications before your next appointment, please call your pharmacy*  Lab Work: NONE ordered at this time of appointment   If you have labs (blood work) drawn today and your tests are completely normal, you will receive your results only by: . MyChart Message (if you have MyChart) OR . A paper copy in the mail If you have any lab test that is abnormal or we need to change your treatment, we will call you to review the results.  Testing/Procedures: NONE ordered at this time of appointment   Follow-Up: At [facility], you and your health needs are our priority.  As part of our continuing mission to provide you with exceptional heart care, we have created designated Provider Care Teams.  These Care Teams include your primary Cardiologist (physician) and Advanced Practice Providers (APPs -  Physician Assistants and Nurse Practitioners) who all work together to provide you with the care you need, when you need it.  We recommend signing up for the patient portal called "MyChart".  Sign up information is provided on this After Visit Summary.  MyChart is used to connect with patients for Virtual Visits (Telemedicine).  Patients are able to view lab/test results, encounter notes, upcoming appointments, etc.  Non-urgent messages can be sent to your provider as well.   To learn more about what you can do with MyChart, go to [email or web address].    Your next appointment:   6 month(s)  The format for your next appointment:   In Person  Provider:   Mihai Croitoru, MD  Other Instructions   

## 2021-01-06 ENCOUNTER — Encounter: Payer: Self-pay | Admitting: Physician Assistant

## 2021-03-25 ENCOUNTER — Other Ambulatory Visit: Payer: Self-pay | Admitting: Cardiovascular Disease

## 2021-03-26 NOTE — Telephone Encounter (Signed)
Prescription refill request for Xarelto received.  Indication:DVT Last office visit:7/22 Weight:87.5 kg Age:75 Scr:0.7 CrCl:94.44 ml/min  Prescription refilled

## 2021-09-04 ENCOUNTER — Telehealth: Payer: Self-pay | Admitting: Cardiovascular Disease

## 2021-09-04 NOTE — Telephone Encounter (Signed)
Left message to call back  

## 2021-09-04 NOTE — Telephone Encounter (Signed)
Pt c/o of Chest Pain: STAT if CP now or developed within 24 hours ? ?1. Are you having CP right now? No, chest tightness  ? ?2. Are you experiencing any other symptoms (ex. SOB, nausea, vomiting, sweating)? No- right arm numbness ? ?3. How long have you been experiencing CP? About 2 weeks ? ?4. Is your CP continuous or coming and going? Comes and goes, more at night ? ?5. Have you taken Nitroglycerin? Do not have any- pt was supposed to see DR C on Thursday, but his schedule changed- so she is now seeing Urban Gibson on Friday- please call to evaluate ?? ? ?

## 2021-09-05 NOTE — Telephone Encounter (Signed)
Spoke with pt, she is having right arm numbness esp at night in the right arm. She has a lot of complaints of pain and numbness in several areas. She reports numbness in the right foot with some discoloration. She also reports it is time for her regular checkup for the DVT as well. Usually the numbness and pain occur at night. She also reports a heavy pain at times in her chest, usually at night. She is concerned because of her family history of CAD. She has an appointment on Friday this week and understands to call the office prior to that appointment if symptoms change or worsen. ?

## 2021-09-06 ENCOUNTER — Ambulatory Visit: Payer: Medicare Other | Admitting: Cardiovascular Disease

## 2021-09-07 ENCOUNTER — Other Ambulatory Visit: Payer: Self-pay

## 2021-09-07 ENCOUNTER — Encounter (HOSPITAL_BASED_OUTPATIENT_CLINIC_OR_DEPARTMENT_OTHER): Payer: Self-pay | Admitting: Family

## 2021-09-07 ENCOUNTER — Ambulatory Visit (HOSPITAL_BASED_OUTPATIENT_CLINIC_OR_DEPARTMENT_OTHER): Payer: Medicare PPO | Admitting: Family

## 2021-09-07 VITALS — BP 134/78 | HR 83 | Ht 63.0 in | Wt 190.3 lb

## 2021-09-07 DIAGNOSIS — K219 Gastro-esophageal reflux disease without esophagitis: Secondary | ICD-10-CM | POA: Diagnosis not present

## 2021-09-07 DIAGNOSIS — Z86718 Personal history of other venous thrombosis and embolism: Secondary | ICD-10-CM

## 2021-09-07 DIAGNOSIS — R079 Chest pain, unspecified: Secondary | ICD-10-CM | POA: Diagnosis not present

## 2021-09-07 DIAGNOSIS — R002 Palpitations: Secondary | ICD-10-CM

## 2021-09-07 DIAGNOSIS — K449 Diaphragmatic hernia without obstruction or gangrene: Secondary | ICD-10-CM

## 2021-09-07 DIAGNOSIS — D6859 Other primary thrombophilia: Secondary | ICD-10-CM

## 2021-09-07 MED ORDER — METOPROLOL SUCCINATE ER 25 MG PO TB24: 25.0000 mg | ORAL_TABLET | Freq: Every day | ORAL | 3 refills | Status: DC

## 2021-09-07 MED ORDER — RIVAROXABAN 20 MG PO TABS: 20.0000 mg | ORAL_TABLET | Freq: Every day | ORAL | 1 refills | Status: DC

## 2021-09-07 NOTE — Patient Instructions (Addendum)
Medication Instructions:  ?Your physician has recommended you make the following change in your medication:  ? ?RESUME Metoprolol one '25mg'$  tablet daily ? ?*If you need a refill on your cardiac medications before your next appointment, please call your pharmacy* ? ? ?Lab Work: ?None ordered today.  ? ?Testing/Procedures: ?Your EKG today showed normal sinus rhythm which is a good result! ? ?Follow-Up: ?At [facility], you and your health needs are our priority.  As part of our continuing mission to provide you with exceptional heart care, we have created designated Provider Care Teams.  These Care Teams include your primary Cardiologist (physician) and Advanced Practice Providers (APPs -  Physician Assistants and Nurse Practitioners) who all work together to provide you with the care you need, when you need it. ? ?We recommend signing up for the patient portal called "MyChart".  Sign up information is provided on this After Visit Summary.  MyChart is used to connect with patients for Virtual Visits (Telemedicine).  Patients are able to view lab/test results, encounter notes, upcoming appointments, etc.  Non-urgent messages can be sent to your provider as well.   ?To learn more about what you can do with MyChart, go to [email or web address].   ? ?Your next appointment:   ?4-6 month(s) ? ?The format for your next appointment:   ?In Person ? ?Provider:   ?Sanda Klein, MD or Advanced Practice Provider  ? ?Other Instructions ? ?To prevent or reduce lower extremity swelling: ?Eat a low salt diet. Salt makes the body hold onto extra fluid which causes swelling. ?Sit with legs elevated. For example, in the recliner or on an [place].  ?Wear knee-high compression stockings during the daytime. Ones labeled 15-20 mmHg provide good compression.  ? ?Recommend establishing with a primary care provider.  ?You may call [place] @ [phone] for a list of primary care providers in your area or visit their  website [email or web address] ?Please have any insurance card available before calling or going online.   ? ?Heart Healthy Diet Recommendations: ?A low-salt diet is recommended. Meats should be grilled, baked, or boiled. Avoid fried foods. Focus on lean protein sources like fish or chicken with vegetables and fruits. The American Heart Association is a Microbiologist!  American Heart Association Diet and Lifeystyle Recommendations   ? ?Exercise recommendations: ?The American Heart Association recommends 150 minutes of moderate intensity exercise weekly. ?Try 30 minutes of moderate intensity exercise 4-5 times per week. ?This could include walking, jogging, or swimming. ? ? ? ?  ?

## 2021-09-07 NOTE — Progress Notes (Signed)
? ?Office Visit  ?  ?Patient Name: Phyllis Austin ?Date of Encounter: 09/07/2021 ? ?PCP:  Pcp, No ?  ?[place]  ?Cardiologist:  Sanda Klein, MD  ?Advanced Practice Provider:  No care team member to display ?Electrophysiologist:  None  ?   ? ?Chief Complaint  ?  ?ALLESANDRA Austin is a 76 y.o. female with a hx of unprovoked DVT of LLE 2018 placed on Xarelto, rheumatoid arthritis, plaque psoriasis, chest pain  presents today for chest pain  ? ?Past Medical History  ?  ?Past Medical History:  ?Diagnosis Date  ? Allergy   ? Cataract   ? surgery to remove  ? Diverticulosis   ? Gallstones   ? GERD (gastroesophageal reflux disease)   ? Glaucoma   ? no med - just watching  ? Hepatitis   ? as a child  ? Hiatal hernia   ? Osteoarthritis   ? Psoriatic arthritis ([place])   ? RA (rheumatoid arthritis) ([place])   ? ?Past Surgical History:  ?Procedure Laterality Date  ? BUNIONECTOMY Right   ? with correction  ? CESAREAN SECTION  10/21/79, 03/28/81  ? x 2  ? CHOLECYSTECTOMY N/A 04/12/2015  ? Procedure: LAPAROSCOPIC CHOLECYSTECTOMY;  Surgeon: [name], MD;  Location: [facility];  Service: General;  Laterality: N/A;  ? COLONOSCOPY  4/20006  ? Sharlett Iles  ? DILATION AND CURETTAGE OF UTERUS    ? EYE SURGERY Bilateral   ? cataract surgery w/ lens implant  ? INTRAOCULAR LENS INSERTION Bilateral   ? TIBIA FRACTURE SURGERY Right 1986  ? Bone transplant  ? WISDOM TOOTH EXTRACTION    ? ? ?Allergies ? ?Allergies  ?Allergen Reactions  ? Methotrexate Derivatives   ? ? ?History of Present Illness  ?  ?Phyllis Austin is a 76 y.o. female with a hx of unprovoked DVT of LLE 2018 placed on Xarelto, rheumatoid arthritis, plaque psoriasis, chest pain last seen 01/03/21. ? ?March 2022 had chest pain while driving home from the beach.  CTA negative for PE though did show atherosclerosis in the aortic arch and chronically elevated right hemidiaphragm.  There was also thoracic spine degenerative changes but no compression fracture.   Echo 10/2020 LVEF 55 to 82%, grade 1 diastolic dysfunction, RV normal size and function, LA mildly dilated, trivial MR, aortic valve mildly calcified without stenosis. Seen in follow-up 10/2020 she was started on metoprolol succinate 25 daily due to dyspnea 70. Subsequent stress test 11/2020 LVEF 53%, no ischemia or infarction or perfusion images-low risk study.  She was last seen 12/2020. ? ?She presents today for follow-up independently. Her son and 27 year old grandson live with me. Shares with me that last week she is having numbness in her right arm and upper back pain near her neck. The RUE numbness mostly bothers her when she is laying down. She notes "a little more bluish" in her right foot. She is worried about DVT - she missed one dose of DVT last week due to locking herself out of the house. No recent travel nor surgery. She has some chest pressure when sitting still which is overall unchanged from previous. Has not been taking metoprolol daily as she mistakenly thought it was as needed. She reports no shortness of breath nor dyspnea on exertion.  No orthopnea, PND. She does not have a primary care provider as she feels she sees some the specialist. ? ?EKGs/Labs/Other Studies Reviewed:  ? ?The following studies were reviewed today: ? ?Myoview 11/2020 ?The  left ventricular ejection fraction is mildly decreased (45-54%). ?Nuclear stress EF: 53%. ?There was no ST segment deviation noted during stress. ?The study is normal. ?This is a low risk study. ?  ?No ischemia or infarction on perfusion images. Normal wall motion.  ? ?Echo 10/2020 ? 1. Left ventricular ejection fraction, by estimation, is 55 to 60%. The  ?left ventricle has normal function. The left ventricle has no regional  ?wall motion abnormalities. Left ventricular diastolic parameters are  ?consistent with Grade I diastolic  ?dysfunction (impaired relaxation).  ? 2. Right ventricular systolic function is normal. The right ventricular  ?size is normal.  There is normal pulmonary artery systolic pressure.  ? 3. Left atrial size was mildly dilated.  ? 4. The mitral valve is normal in structure. Trivial mitral valve  ?regurgitation. No evidence of mitral stenosis.  ? 5. The aortic valve is tricuspid. There is mild calcification of the  ?aortic valve. There is mild thickening of the aortic valve. Aortic valve  ?regurgitation is not visualized. No aortic stenosis is present.  ? 6. The inferior vena cava is normal in size with greater than 50%  ?respiratory variability, suggesting right atrial pressure of 3 mmHg.  ? ?EKG:  EKG is  ordered today.  The ekg ordered today demonstrates NSR 83 bpm with no acute ST/T wave changes.  ? ?Recent Labs: ?09/20/2020: BNP 17.2; BUN 14; Creatinine, Ser 0.70; Hemoglobin 13.8; Platelets 361; Potassium 4.0; Sodium 141  ?Recent Lipid Panel ?No results found for: CHOL, TRIG, HDL, CHOLHDL, VLDL, LDLCALC, LDLDIRECT ? ?Home Medications  ? ?Current Meds  ?Medication Sig  ? acetaminophen (TYLENOL) 650 MG CR tablet Take 1,300 mg by mouth every 8 (eight) hours as needed for pain.  ? albuterol (VENTOLIN HFA) 108 (90 Base) MCG/ACT inhaler Inhale 2 puffs into the lungs every 6 (six) hours as needed for wheezing or shortness of breath.  ? Biotin 5 MG TABS Take 1 tablet by mouth at bedtime.  ? calcium acetate (PHOSLO) 667 MG capsule Take 667 mg by mouth at bedtime.  ? cholestyramine (QUESTRAN) 4 g packet Take 1 packet (4 g total) by mouth 2 (two) times daily.  ? Doxylamine Succinate, Sleep, (SLEEP AID PO) Take 1 tablet by mouth at bedtime.  ? InFLIXimab (REMICADE IV) Inject into the vein See admin instructions. Every 8 weeks at Dr. [name] office  ? metoprolol succinate (TOPROL-XL) 25 MG 24 hr tablet Take 1 tablet (25 mg total) by mouth daily.  ? pantoprazole (PROTONIX) 20 MG tablet Take 1 tablet (20 mg total) by mouth daily.  ? Polyethyl Glycol-Propyl Glycol (SYSTANE OP) Place 1 drop into both eyes daily as needed (dry eyes).  ? rivaroxaban (XARELTO)  20 MG TABS tablet TAKE 1 TABLET BY MOUTH DAILY WITH SUPPER.  ? traMADol (ULTRAM) 50 MG tablet Take 50 mg by mouth at bedtime.  ?  ? ?Review of Systems  ? ?All other systems reviewed and are otherwise negative except as noted above. ? ?Physical Exam  ?  ?VS:  There were no vitals taken for this visit. , BMI There is no height or weight on file to calculate BMI. ? ?Wt Readings from Last 3 Encounters:  ?01/03/21 192 lb 12.8 oz (87.5 kg)  ?11/30/20 190 lb (86.2 kg)  ?11/17/20 190 lb (86.2 kg)  ?  ?GEN: Well nourished, well developed, in no acute distress. ?HEENT: normal. ?Neck: Supple, no JVD, carotid bruits, or masses. ?Cardiac: RRR, no murmurs, rubs, or gallops. No clubbing, cyanosis, edema.  Radials/PT 2+ and equal bilaterally.  ?Respiratory:  Respirations regular and unlabored, clear to auscultation bilaterally. ?GI: Soft, nontender, nondistended. ?MS: No deformity or atrophy. ?Skin: Warm and dry, no rash. Bilateral lower extremities with varicose veins.  ?Neuro:  Strength and sensation are intact. ?Psych: Normal affect. ? ?Assessment & Plan  ?  ?RUE numbness -Reports 2 weeks ago she began having right upper extremity numbness which is positional when laying on that side. Encouraged to sleep on other side. We discussed likely etiology of nerve pain.  Bilateral radial pulses 2+ and full ROM of RUE. No recent injury. Nontender on palpation. She does not have PCP and encouraged to establish. Reassured that this pain is non cardiac.  ? ?Chest pain in adult /exertional dyspnea - Stress test 11/2020 low risk study. She has only been taking Metoprolol PRN as she misunderstood directions. She is agreeable to resume taking daily and refill provided.  EKG today sinus rhythm 83 bpm with no acute ST/T wave changes.  No indication for ischemic evaluation.  Her chest pain is atypical as it occurs at rest.  Likely etiology anxiety, hiatal hernia, GERD.  Her exertional dyspnea is not bothering her at this time. ? ?Hx unprovoked DVT  - On long term Xarelto.  Denies bleeding complications.  Refill provided. ? ?Hiatal hernia / GERD -likely contributory to some atypical chest discomfort.  Continue Protonix. GERD friendly  diet encou

## 2021-11-09 ENCOUNTER — Telehealth: Payer: Self-pay | Admitting: Cardiovascular Disease

## 2021-11-09 DIAGNOSIS — Z86718 Personal history of other venous thrombosis and embolism: Secondary | ICD-10-CM

## 2021-11-09 MED ORDER — RIVAROXABAN 20 MG PO TABS: 20.0000 mg | ORAL_TABLET | Freq: Every day | ORAL | 1 refills | Status: DC

## 2021-11-09 NOTE — Telephone Encounter (Signed)
Looks like last Rx was sent for 90 days but I will send in new Rx regardless

## 2021-11-09 NOTE — Telephone Encounter (Signed)
Pt c/o medication issue:  1. Name of Medication: rivaroxaban (XARELTO) 20 MG TABS tablet  2. How are you currently taking this medication (dosage and times per day)? Once daily as directed  3. Are you having a reaction (difficulty breathing--STAT)? no  4. What is your medication issue? Cost  Patient wanted to know if Dr. Loletha Grayer would write a new rx for her pharmacy to dispense a 90 day supply instead of a 30 day supply. She has switched insurances and now she may be able to get it for a discount if its filled for 90 days at a time

## 2021-11-09 NOTE — Telephone Encounter (Signed)
Routed to [facility] anticoag pool re: xarelto Rx

## 2022-05-14 ENCOUNTER — Telehealth: Payer: Self-pay | Admitting: Cardiovascular Disease

## 2022-05-14 NOTE — Telephone Encounter (Signed)
Called pt she states that her left legs is hot, painful and throbbing this started Sunday, after she went to church on Sunday 11-19, she went to bed to take a nap and she was awakened with this pain. Pain/redness has been getting worse since.. 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 Denies any trauma to this area. She states that she is taking her Xarelto and has not missed any doses.  Discussed with DOD no appt is needed, he thinks that she has cellulitis, make appt with PCP. Pt states that she has not gotten another PCP since her old on is "deceased". She will need to go to the ER then find a PCP and follow up after. Pt notified she will go to the ER.

## 2022-05-14 NOTE — Telephone Encounter (Signed)
Pt states her left legs is hot and throbbing. Pt only wants to be seen in [facility], she made an appt for 06/26/22 and was placed on waitlist for something sooner

## 2022-05-20 ENCOUNTER — Ambulatory Visit (HOSPITAL_BASED_OUTPATIENT_CLINIC_OR_DEPARTMENT_OTHER): Payer: Medicare PPO | Admitting: Family

## 2022-05-20 ENCOUNTER — Encounter (HOSPITAL_BASED_OUTPATIENT_CLINIC_OR_DEPARTMENT_OTHER): Payer: Self-pay | Admitting: Family

## 2022-05-20 VITALS — BP 118/70 | HR 75 | Ht 63.0 in | Wt 198.0 lb

## 2022-05-20 DIAGNOSIS — K219 Gastro-esophageal reflux disease without esophagitis: Secondary | ICD-10-CM

## 2022-05-20 DIAGNOSIS — D6859 Other primary thrombophilia: Secondary | ICD-10-CM | POA: Diagnosis not present

## 2022-05-20 DIAGNOSIS — I83892 Varicose veins of left lower extremities with other complications: Secondary | ICD-10-CM

## 2022-05-20 DIAGNOSIS — Z86718 Personal history of other venous thrombosis and embolism: Secondary | ICD-10-CM | POA: Diagnosis not present

## 2022-05-20 DIAGNOSIS — L03116 Cellulitis of left lower limb: Secondary | ICD-10-CM

## 2022-05-20 MED ORDER — FUROSEMIDE 20 MG PO TABS: 20.0000 mg | ORAL_TABLET | Freq: Every day | ORAL | 0 refills | Status: DC

## 2022-05-20 MED ORDER — CEPHALEXIN 500 MG PO CAPS: 500.0000 mg | ORAL_CAPSULE | Freq: Four times a day (QID) | ORAL | 0 refills | Status: DC

## 2022-05-20 NOTE — Patient Instructions (Addendum)
Medication Instructions:  Your physician has recommended you make the following change in your medication:  START Keflex  500 mg 4 times daily for 5 days  *If you need a refill on your cardiac medications before your next appointment, please call your pharmacy*  Lab Work/Testing/Procedures: None ordered today.    Follow-Up: At [facility], you and your health needs are our priority.  As part of our continuing mission to provide you with exceptional heart care, we have created designated Provider Care Teams.  These Care Teams include your primary Cardiologist (physician) and Advanced Practice Providers (APPs -  Physician Assistants and Nurse Practitioners) who all work together to provide you with the care you need, when you need it.  We recommend signing up for the patient portal called "MyChart".  Sign up information is provided on this After Visit Summary.  MyChart is used to connect with patients for Virtual Visits (Telemedicine).  Patients are able to view lab/test results, encounter notes, upcoming appointments, etc.  Non-urgent messages can be sent to your provider as well.   To learn more about what you can do with MyChart, go to [email or web address].    Your next appointment:   May 20th, 2024 at 1:20 PM with Sanda Klein, MD     Other Instructions  To prevent or reduce lower extremity swelling: Eat a low salt diet. Salt makes the body hold onto extra fluid which causes swelling. Sit with legs elevated. For example, in the recliner or on an [place].  Wear knee-high compression stockings during the daytime. Ones labeled 15-20 mmHg provide good compression.  _______  Recommend establishing with a primary care provider. [facility] are a great primary care.   Contact [place], [place] [address]  [place]: [phone] Sports Medicine: [phone] Fax: [phone] Hours (M-F): 8 am - 5 pm

## 2022-05-20 NOTE — Addendum Note (Signed)
Addended by: Gerald Stabs on: 05/20/2022 03:17 PM   Modules accepted: Orders

## 2022-05-20 NOTE — Progress Notes (Signed)
Office Visit    Patient Name: Phyllis Austin Date of Encounter: 05/20/2022  PCP:  Kathyrn Lass   [place]  Cardiologist:  Sanda Klein, MD  Advanced Practice Provider:  No care team member to display Electrophysiologist:  None      Chief Complaint    Phyllis Austin is a 76 y.o. female presents today for concerns about leg swelling.   Past Medical History    Past Medical History:  Diagnosis Date   Allergy    Cataract    surgery to remove   Diverticulosis    Gallstones    GERD (gastroesophageal reflux disease)    Glaucoma    no med - just watching   Hepatitis    as a child   Hiatal hernia    Osteoarthritis    Psoriatic arthritis ([place])    RA (rheumatoid arthritis) ([place])    Past Surgical History:  Procedure Laterality Date   BUNIONECTOMY Right    with correction   CESAREAN SECTION  10/21/79, 03/28/81   x 2   CHOLECYSTECTOMY N/A 04/12/2015   Procedure: LAPAROSCOPIC CHOLECYSTECTOMY;  Surgeon: [name], MD;  Location: [facility];  Service: General;  Laterality: N/A;   COLONOSCOPY  4/20006   Sharlett Iles   DILATION AND CURETTAGE OF UTERUS     EYE SURGERY Bilateral    cataract surgery w/ lens implant   INTRAOCULAR LENS INSERTION Bilateral    TIBIA FRACTURE SURGERY Right 1986   Bone transplant   WISDOM TOOTH EXTRACTION      Allergies  Allergies  Allergen Reactions   Methotrexate Derivatives     History of Present Illness    Phyllis Austin is a 76 y.o. 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  CTA negative for PE though did show atherosclerosis in the aortic arch and chronically elevated right hemidiaphragm.  There was also thoracic spine degenerative changes but no compression fracture.  Echo 10/2020 LVEF 55 to 16%, grade 1 diastolic dysfunction, RV normal size and function,  LA mildly dilated, trivial MR, aortic valve mildly calcified without stenosis. Seen in follow-up 10/2020 she was started on metoprolol succinate 25 daily due to dyspnea 70. Subsequent stress test 11/2020 LVEF 53%, no ischemia or infarction or perfusion images-low risk study.    Most recently seen 09/07/2021.  Noted numbness in her right arm and upper back near her left most often when she is laying down.  Reassurance provided it was noncardiac and was recommended to resume her metoprolol 25 mg daily for cardioprotective benefit.  Contacted the office 05/14/2022 noting LLE hot, painful, throbbing since Sunday.  She had not missed any doses of Xarelto.  She was recommended for evaluation in the ER but does not appear it was performed.  She presents today for follow-up independently. Her son and 29 year old grandson live with her. Tells me her left leg has been throbbing and swollen for just over a week. No swelling in her right leg. Last week it was warm and she stayed off of it. Tells me it felt like "it might have a little fever in it". She did take some Tylenol. She has not missed no doses of her Xarelto. She did not go to ER nor urgent care, tells me "I'm not one to go". Notes her exertional dyspnea is stable compared to  previous and only noticeable with more than usual activity. No chest pain, pressure, tightness. Does note some stressors related to her son and grandson living with her. She does eat out most of the time - discussed how sodium intake would impact swelling.   EKGs/Labs/Other Studies Reviewed:   The following studies were reviewed today:  Myoview 11/2020 The left ventricular ejection fraction is mildly decreased (45-54%). Nuclear stress EF: 53%. There was no ST segment deviation noted during stress. The study is normal. This is a low risk study.   No ischemia or infarction on perfusion images. Normal wall motion.   Echo 10/2020  1. Left ventricular ejection fraction, by estimation,  is 55 to 60%. The  left ventricle has normal function. The left ventricle has no regional  wall motion abnormalities. Left ventricular diastolic parameters are  consistent with Grade I diastolic  dysfunction (impaired relaxation).   2. Right ventricular systolic function is normal. The right ventricular  size is normal. There is normal pulmonary artery systolic pressure.   3. Left atrial size was mildly dilated.   4. The mitral valve is normal in structure. Trivial mitral valve  regurgitation. No evidence of mitral stenosis.   5. The aortic valve is tricuspid. There is mild calcification of the  aortic valve. There is mild thickening of the aortic valve. Aortic valve  regurgitation is not visualized. No aortic stenosis is present.   6. The inferior vena cava is normal in size with greater than 50%  respiratory variability, suggesting right atrial pressure of 3 mmHg.   EKG:  EKG is ordered today.  EKG performed today demonstrates NSR 69 bpm with no acute ST/T wave changes.  Recent Labs: No results found for requested labs within last 365 days.  Recent Lipid Panel No results found for: "CHOL", "TRIG", "HDL", "CHOLHDL", "VLDL", "LDLCALC", "LDLDIRECT"  Home Medications   Current Meds  Medication Sig   acetaminophen (TYLENOL) 650 MG CR tablet Take 1,300 mg by mouth every 8 (eight) hours as needed for pain.   albuterol (VENTOLIN HFA) 108 (90 Base) MCG/ACT inhaler Inhale 2 puffs into the lungs every 6 (six) hours as needed for wheezing or shortness of breath.   Biotin 5 MG TABS Take 1 tablet by mouth at bedtime.   calcium acetate (PHOSLO) 667 MG capsule Take 667 mg by mouth at bedtime.   Doxylamine Succinate, Sleep, (SLEEP AID PO) Take 1 tablet by mouth at bedtime.   InFLIXimab (REMICADE IV) Inject into the vein See admin instructions. Every 8 weeks at Dr. [name] office   metoprolol succinate (TOPROL-XL) 25 MG 24 hr tablet Take 1 tablet (25 mg total) by mouth daily.   pantoprazole  (PROTONIX) 20 MG tablet Take 1 tablet (20 mg total) by mouth daily.   Polyethyl Glycol-Propyl Glycol (SYSTANE OP) Place 1 drop into both eyes daily as needed (dry eyes).   rivaroxaban (XARELTO) 20 MG TABS tablet Take 1 tablet (20 mg total) by mouth daily with supper.   traMADol (ULTRAM) 50 MG tablet Take 50 mg by mouth at bedtime.     Review of Systems   All other systems reviewed and are otherwise negative except as noted above.  Physical Exam    VS:  BP 118/70   Pulse 75   Ht '5\' 3"'$  (1.6 m)   Wt 198 lb (89.8 kg)   BMI 35.07 kg/m  , BMI Body mass index is 35.07 kg/m.  Wt Readings from Last 3 Encounters:  05/20/22 198 lb (89.8 kg)  09/07/21 190 lb 4.8 oz (86.3 kg)  01/03/21 192 lb 12.8 oz (87.5 kg)    GEN: Well nourished, overweight, well developed, in no acute distress. HEENT: normal. Neck: Supple, no JVD, carotid bruits, or masses. Cardiac: RRR, no murmurs, rubs, or gallops. No clubbing, cyanosis, edema.  Radials/PT 2+ and equal bilaterally.  Respiratory:  Respirations regular and unlabored, clear to auscultation bilaterally. GI: Soft, nontender, nondistended. MS: No deformity or atrophy. Skin: Warm and dry, no rash. Bilateral lower extremities with varicose veins. LLE with mild erythema to left calf.  Neuro:  Strength and sensation are intact. Psych: Normal affect.  Assessment & Plan   LLE cellulitis / LLE edema - 1 week history of LLE edema. No missed doses of Xarelto - low suspicion for DVT. No indication for duplex. Eats out predominantly. Varicose veins on exam. Encouraged to elevate legs, follow low sodium diet. Will Rx Keflex '500mg'$  QID x 5 days to cover for cellulitis. Offered short course of Lasix x 3 days which she declined. Instructed to seek evaluation of urgent care, ED, or PCP for worsening or unresolved symptoms.   Hx unprovoked DVT - On long term Xarelto.  Denies bleeding complications.  Refill provided. No missed doses.   Hiatal hernia / GERD -likely  contributory to some atypical chest discomfort.  Continue Protonix. GERD friendly  diet encouraged.   Disposition: Follow up in 6 month(s) with Sanda Klein, MD or APP.  Signed, Loel Dubonnet, NP 05/20/2022, 2:58 PM [place]

## 2022-05-22 NOTE — Telephone Encounter (Signed)
Patient has been seen.

## 2022-05-23 NOTE — Addendum Note (Signed)
Addended by: Loel Dubonnet on: 05/23/2022 11:50 AM   Modules accepted: Orders

## 2022-06-26 ENCOUNTER — Ambulatory Visit: Payer: Medicare PPO | Admitting: Physician Assistant

## 2022-08-09 ENCOUNTER — Telehealth: Payer: Self-pay | Admitting: Cardiovascular Disease

## 2022-08-09 ENCOUNTER — Ambulatory Visit (HOSPITAL_COMMUNITY)
Admission: RE | Admit: 2022-08-09 | Discharge: 2022-08-09 | Disposition: A | Discharge status: Home / Self Care | Payer: Medicare PPO | Source: Ambulatory Visit | Attending: Internal Medicine | Admitting: Internal Medicine

## 2022-08-09 DIAGNOSIS — Z86718 Personal history of other venous thrombosis and embolism: Secondary | ICD-10-CM

## 2022-08-09 DIAGNOSIS — I82411 Acute embolism and thrombosis of right femoral vein: Secondary | ICD-10-CM

## 2022-08-09 DIAGNOSIS — I82433 Acute embolism and thrombosis of popliteal vein, bilateral: Secondary | ICD-10-CM | POA: Diagnosis not present

## 2022-08-09 NOTE — Telephone Encounter (Signed)
Patient states she has been kept up throughout the night with throbbing pain behind her knee. She says she is not sure if she needs a test to see what is going on. She says her knee wants to buckle. She says she had came in and saw [facility], but they did not think it was a blood clot. Please advise.

## 2022-08-09 NOTE — Telephone Encounter (Signed)
There is some fluid in the left popliteal space, but there is no evidence of a fresh clot.  There is some old recanalized clot in the veins in the left leg.  As she had fever?  Is there any redness or warmth in that area?

## 2022-08-09 NOTE — Telephone Encounter (Signed)
Not a clot problem, not a Baker's cyst and does not sound like cellulitis. Unlikely to be a dangerous problem, but I am not sure what th e problem is. Hard to say what it is without examination, but even if I did examine, may not be able to render a diagnosis. Keeping the leg elevated should help. Has she seen PCP? If there is redness in the area or she has fever/chills, I would treat with antibiotics for possible cellulitis. Has she seen PCP?

## 2022-08-09 NOTE — Telephone Encounter (Signed)
Pat does not have PCP. Recommended urgent care to be evaluated. She stated OK. She does elevate legs when sitting and while in bed.

## 2022-08-09 NOTE — Telephone Encounter (Addendum)
Patient given doppler results per Dr. Sallyanne Kuster. She stated the back of knee is warm and it hurts to be up on her feet for very long. Doesn't think she has fever.

## 2022-08-09 NOTE — Telephone Encounter (Signed)
Pt stated she has h/o DVT. Reports throbbing pain behind left knees. Feels like knee wants to buckle. Has not missed Xarelto dosing. Dr. Harl Bowie (DOD) ordered Venous Doppler lt knee. Scheduled today for 12:30 . Pt will come for procedure.

## 2022-08-12 NOTE — Telephone Encounter (Signed)
Spoke with pt regarding fluid behind her knee. Pt went to an urgent care in [place] and they were unable to see our records to help treat her. Advised pt to call [facility] to help find her a PCP. Pt mentions that she has seen Laurann Montana, NP in the past and was given antibiotics because of suspected cellulitis. At this time Urban Gibson also recommended pt call [facility] to get established, provided pt with that phone number as well. Pt verbalizes understanding and will give them a call.

## 2022-08-12 NOTE — Telephone Encounter (Signed)
Patient is requesting call back again in regards to the fluid on the back of her knee. She states she was not able to get help at the [facility]. Requesting call back to discuss.

## 2022-08-20 ENCOUNTER — Ambulatory Visit (INDEPENDENT_AMBULATORY_CARE_PROVIDER_SITE_OTHER): Payer: Medicare PPO | Admitting: Family Medicine

## 2022-08-20 ENCOUNTER — Encounter: Payer: Self-pay | Admitting: Family Medicine

## 2022-08-20 VITALS — BP 134/75 | HR 90 | Temp 98.6°F | Ht 63.0 in | Wt 197.0 lb

## 2022-08-20 DIAGNOSIS — M7122 Synovial cyst of popliteal space [Baker], left knee: Secondary | ICD-10-CM | POA: Diagnosis not present

## 2022-08-20 DIAGNOSIS — K529 Noninfective gastroenteritis and colitis, unspecified: Secondary | ICD-10-CM | POA: Diagnosis not present

## 2022-08-20 DIAGNOSIS — Z7901 Long term (current) use of anticoagulants: Secondary | ICD-10-CM

## 2022-08-20 DIAGNOSIS — M069 Rheumatoid arthritis, unspecified: Secondary | ICD-10-CM

## 2022-08-20 DIAGNOSIS — L409 Psoriasis, unspecified: Secondary | ICD-10-CM

## 2022-08-20 DIAGNOSIS — R3589 Other polyuria: Secondary | ICD-10-CM | POA: Diagnosis not present

## 2022-08-20 DIAGNOSIS — E669 Obesity, unspecified: Secondary | ICD-10-CM

## 2022-08-20 DIAGNOSIS — I872 Venous insufficiency (chronic) (peripheral): Secondary | ICD-10-CM

## 2022-08-20 MED ORDER — TRAMADOL HCL 50 MG PO TABS: 50.0000 mg | ORAL_TABLET | Freq: Every day | ORAL | 5 refills | Status: DC

## 2022-08-20 MED ORDER — CLOBETASOL PROPIONATE 0.05 % EX SOLN: 1.0000 | Freq: Two times a day (BID) | CUTANEOUS | 5 refills | Status: AC

## 2022-08-20 NOTE — Progress Notes (Unsigned)
New Patient Office Visit  Subjective    Patient ID: Phyllis Austin, female    DOB: [birth year]  Age: 77 y.o. MRN: [id number]  CC:  Chief Complaint  Patient presents with   Establish Care    HPI Phyllis Austin presents to establish care Patinet is reporting a new symptom of right leg throbbing for the past week. States that she was told she had "water on the back of her knee". States that she has a history of blood clots and she is on xarelto. States that she had some swelling from that and has had some swelling in her legs for a while. States that the left leg is still hurting. I have reviewed her venous duplex study on 08/09/22 and showed a chronic DVT but also a fluid filled area in the popliteal space. States that she cannot take NSAIDS due to being on chronic blood thinners, does take tramadol at night for the pain and also tylenol at home.   Patient has a history of rheumatoid arthritis and is on remicade for many years. States that this causes a lot of widespread joint issues and occasional pain.   Pt is reporting difficulty with chronic diarrhea. States that she has to take extra undies with her because oftentimes she will not be able to make it to the bathroom. States this has been going on for years, maybe since she had her GB removed. Pt states she uses loperamide OTC a couple times a day to control it, especially when she leaves the home, can't really take it every day because it will cause constipation if she takes it daily. Tried fiber packets but this worsened the diarrhea.  Outpatient Encounter Medications as of 08/20/2022  Medication Sig   acetaminophen (TYLENOL) 650 MG CR tablet Take 1,300 mg by mouth every 8 (eight) hours as needed for pain.   albuterol (VENTOLIN HFA) 108 (90 Base) MCG/ACT inhaler Inhale 2 puffs into the lungs every 6 (six) hours as needed for wheezing or shortness of breath.   Biotin 5 MG TABS Take 1 tablet by mouth at bedtime.   calcium acetate  (PHOSLO) 667 MG capsule Take 667 mg by mouth at bedtime.   Doxylamine Succinate, Sleep, (SLEEP AID PO) Take 1 tablet by mouth at bedtime.   InFLIXimab (REMICADE IV) Inject into the vein See admin instructions. Every 8 weeks at Dr. [name] office   metoprolol succinate (TOPROL-XL) 25 MG 24 hr tablet Take 1 tablet (25 mg total) by mouth daily.   pantoprazole (PROTONIX) 20 MG tablet Take 1 tablet (20 mg total) by mouth daily.   Polyethyl Glycol-Propyl Glycol (SYSTANE OP) Place 1 drop into both eyes daily as needed (dry eyes).   rivaroxaban (XARELTO) 20 MG TABS tablet Take 1 tablet (20 mg total) by mouth daily with supper.   [DISCONTINUED] cephALEXin (KEFLEX) 500 MG capsule Take 1 capsule (500 mg total) by mouth 4 (four) times daily. For 5 days then stop.   [DISCONTINUED] clobetasol (TEMOVATE) 0.05 % external solution Apply 1 Application topically 2 (two) times daily.   [DISCONTINUED] traMADol (ULTRAM) 50 MG tablet Take 50 mg by mouth at bedtime.   clobetasol (TEMOVATE) 0.05 % external solution Apply 1 Application topically 2 (two) times daily.   traMADol (ULTRAM) 50 MG tablet Take 1 tablet (50 mg total) by mouth at bedtime.   No facility-administered encounter medications on file as of 08/20/2022.    Past Medical History:  Diagnosis Date   Allergy  Cataract    surgery to remove   Diverticulosis    Gallstones    GERD (gastroesophageal reflux disease)    Glaucoma    no med - just watching   Hepatitis    as a child   Hiatal hernia    Osteoarthritis    Psoriatic arthritis ([place])    RA (rheumatoid arthritis) ([place])     Past Surgical History:  Procedure Laterality Date   BUNIONECTOMY Right    with correction   CESAREAN SECTION  10/21/79, 03/28/81   x 2   CHOLECYSTECTOMY N/A 04/12/2015   Procedure: LAPAROSCOPIC CHOLECYSTECTOMY;  Surgeon: [name], MD;  Location: [facility];  Service: General;  Laterality: N/A;   COLONOSCOPY  4/20006   Sharlett Iles   DILATION AND CURETTAGE OF UTERUS      EYE SURGERY Bilateral    cataract surgery w/ lens implant   INTRAOCULAR LENS INSERTION Bilateral    TIBIA FRACTURE SURGERY Right 1986   Bone transplant   WISDOM TOOTH EXTRACTION      Family History  Problem Relation Age of Onset   Diabetes Mother    Heart attack Mother    Transient ischemic attack Mother    Diabetes Brother    Diabetes Maternal Grandmother    Colon cancer Neg Hx    Esophageal cancer Neg Hx    Stomach cancer Neg Hx     Social History   Socioeconomic History   Marital status: Married    Spouse name: Not on file   Number of children: 2   Years of education: Not on file   Highest education level: Not on file  Occupational History   Occupation: retired   Tobacco Use   Smoking status: Never   Smokeless tobacco: Never  Substance and Sexual Activity   Alcohol use: No    Alcohol/week: 0.0 standard drinks of alcohol   Drug use: No   Sexual activity: Not on file  Other Topics Concern   Not on file  Social History Narrative   Not on file   Social Determinants of Health   Financial Resource Strain: Not on file  Food Insecurity: Not on file  Transportation Needs: Not on file  Physical Activity: Not on file  Stress: Not on file  Social Connections: Not on file  Intimate Partner Violence: Not on file    Review of Systems  All other systems reviewed and are negative.       Objective    BP 134/75 (BP Location: Right Arm, Patient Position: Sitting, Cuff Size: Large)   Pulse 90   Temp 98.6 F (37 C) (Oral)   Ht '5\' 3"'$  (1.6 m)   Wt 197 lb (89.4 kg)   SpO2 95%   BMI 34.90 kg/m   Physical Exam Vitals reviewed.  Constitutional:      Appearance: Normal appearance. She is well-groomed. She is obese.  Eyes:     Conjunctiva/sclera: Conjunctivae normal.  Neck:     Thyroid: No thyromegaly.  Cardiovascular:     Rate and Rhythm: Normal rate and regular rhythm.     Pulses: Normal pulses.     Heart sounds: S1 normal and S2 normal.     Comments:  Patient has significant large varicose veins in the BLE, there is no erythema, TTP or other signs of acute inflammation Pulmonary:     Effort: Pulmonary effort is normal.     Breath sounds: Normal breath sounds and air entry.  Abdominal:     General: Bowel sounds  are normal.  Musculoskeletal:     Left knee: Swelling (large popliteal cyst in the posterior knee space) present.     Right lower leg: 1+ Edema present.     Left lower leg: 1+ Edema present.  Neurological:     Mental Status: She is alert and oriented to person, place, and time. Mental status is at baseline.     Gait: Gait is intact.  Psychiatric:        Mood and Affect: Mood and affect normal.        Speech: Speech normal.        Behavior: Behavior normal.        Judgment: Judgment normal.         Assessment & Plan:   Problem List Items Addressed This Visit       Unprioritized   Rheumatoid arthritis involving multiple sites ([place]) - Primary    On remicade injections monthly with her rheumatologist. I will treat her pain with PRN tramadol 50 mg daily. Patient cannot take NSAIDS at this time due to blood thinner use.       Relevant Medications   traMADol (ULTRAM) 50 MG tablet   Other Relevant Orders   Ambulatory referral to Orthopedic Surgery   Peripheral venous insufficiency    Large tortuous varicose veins in the BL extremities. Is currently on Xarelto 20 mg daily due to DVT in the LLE. We discussed using compression stockings to help reduce swelling and pain from her varicose veins. If this does not help then I would recommend referral to vascular surgery      Long term current use of anticoagulant    Needs new CBC today for surveillance.      Relevant Orders   CBC with Differential/Platelets   Scalp psoriasis (Chronic)   Relevant Medications   clobetasol (TEMOVATE) 0.05 % external solution   Obesity (BMI 30-39.9)    pt expresses a desire to lose weight, I will begin her work up with a new set of lab work.        Relevant Orders   Lipid Panel   TSH   Chronic diarrhea   Relevant Orders   CMP   Polyuria    Checking A1C to rule out diabetes      Relevant Orders   Hemoglobin A1c   Popliteal cyst, left    Most likely due to the RA, will send to orthpedics for recommendations       Relevant Orders   Ambulatory referral to Orthopedic Surgery    Return in about 3 months (around 11/18/2022) for annual physical exam.   Farrel Conners, MD

## 2022-08-22 DIAGNOSIS — K529 Noninfective gastroenteritis and colitis, unspecified: Secondary | ICD-10-CM | POA: Insufficient documentation

## 2022-08-22 DIAGNOSIS — M7122 Synovial cyst of popliteal space [Baker], left knee: Secondary | ICD-10-CM | POA: Insufficient documentation

## 2022-08-22 DIAGNOSIS — R3589 Other polyuria: Secondary | ICD-10-CM | POA: Insufficient documentation

## 2022-08-22 DIAGNOSIS — L409 Psoriasis, unspecified: Secondary | ICD-10-CM | POA: Insufficient documentation

## 2022-08-22 DIAGNOSIS — E669 Obesity, unspecified: Secondary | ICD-10-CM | POA: Insufficient documentation

## 2022-08-22 NOTE — Assessment & Plan Note (Signed)
Most likely due to the RA, will send to orthpedics for recommendations

## 2022-08-22 NOTE — Assessment & Plan Note (Signed)
On remicade injections monthly with her rheumatologist. I will treat her pain with PRN tramadol 50 mg daily. Patient cannot take NSAIDS at this time due to blood thinner use.

## 2022-08-22 NOTE — Assessment & Plan Note (Signed)
pt expresses a desire to lose weight, I will begin her work up with a new set of lab work.

## 2022-08-22 NOTE — Assessment & Plan Note (Signed)
Checking A1C to rule out diabetes

## 2022-08-22 NOTE — Assessment & Plan Note (Signed)
Needs new CBC today for surveillance.

## 2022-08-22 NOTE — Assessment & Plan Note (Signed)
Large tortuous varicose veins in the BL extremities. Is currently on Xarelto 20 mg daily due to DVT in the LLE. We discussed using compression stockings to help reduce swelling and pain from her varicose veins. If this does not help then I would recommend referral to vascular surgery

## 2022-08-26 ENCOUNTER — Telehealth: Payer: Self-pay | Admitting: Family Medicine

## 2022-08-26 ENCOUNTER — Other Ambulatory Visit: Payer: Self-pay | Admitting: Family Medicine

## 2022-08-26 DIAGNOSIS — M069 Rheumatoid arthritis, unspecified: Secondary | ICD-10-CM

## 2022-08-26 MED ORDER — TRAMADOL HCL 50 MG PO TABS: 50.0000 mg | ORAL_TABLET | Freq: Every day | ORAL | 1 refills | Status: DC

## 2022-08-26 NOTE — Telephone Encounter (Signed)
Pt is calling and she would like #90 tramadol instead of #30 its cheaper to get #90 please advise  [id number] [place],  - [address] Phone: [phone] Fax: [phone]

## 2022-09-20 ENCOUNTER — Other Ambulatory Visit: Payer: Self-pay | Admitting: Cardiovascular Disease

## 2022-09-20 DIAGNOSIS — Z86718 Personal history of other venous thrombosis and embolism: Secondary | ICD-10-CM

## 2022-09-20 NOTE — Telephone Encounter (Signed)
Prescription refill request for Xarelto received.  Indication: dvt  Last office visit: Phyllis Austin, 05/20/2022 Weight: 89.4 kg  Age: 77 yo  Scr: .74, 02/13/2022 CrCl: 88 ml/min   Refill sent.

## 2022-09-27 ENCOUNTER — Telehealth: Payer: Self-pay | Admitting: Family Medicine

## 2022-09-27 NOTE — Telephone Encounter (Signed)
Contacted Phyllis Austin to schedule their annual wellness visit. Appointment made for 10/09/22.  [name] AWV direct phone # [phone]

## 2022-10-09 ENCOUNTER — Ambulatory Visit (INDEPENDENT_AMBULATORY_CARE_PROVIDER_SITE_OTHER): Payer: Medicare PPO

## 2022-10-09 VITALS — Ht 63.0 in | Wt 197.0 lb

## 2022-10-09 DIAGNOSIS — Z0000 Encounter for general adult medical examination without abnormal findings: Secondary | ICD-10-CM | POA: Diagnosis not present

## 2022-10-09 NOTE — Progress Notes (Signed)
Subjective:   Phyllis Austin is a 77 y.o. female who presents for Medicare Annual (Subsequent) preventive examination.  Review of Systems    Virtual Visit via Telephone Note  I connected with  Phyllis Austin on 10/09/22 at  8:15 AM EDT by telephone and verified that I am speaking with the correct person using two identifiers.  Location: Patient: Home Provider: Office Persons participating in the virtual visit: patient/Nurse Health Advisor   I discussed the limitations, risks, security and privacy concerns of performing an evaluation and management service by telephone and the availability of in person appointments. The patient expressed understanding and agreed to proceed.  Interactive audio and video telecommunications were attempted between this nurse and patient, however failed, due to patient having technical difficulties OR patient did not have access to video capability.  We continued and completed visit with audio only.  Some vital signs may be absent or patient reported.   Phyllis Rung, LPN  Cardiac Risk Factors include: advanced age (>29men, >31 women)     Objective:    Today's Vitals   10/09/22 0818  Weight: 197 lb (89.4 kg)  Height: 5\' 3"  (1.6 m)   Body mass index is 34.9 kg/m.     10/09/2022    8:27 AM 03/19/2017   10:50 AM 01/24/2017   11:16 AM 07/17/2016    2:32 PM 05/20/2016   11:00 PM 05/20/2016   10:14 AM 05/03/2016   10:27 AM  Advanced Directives  Does Patient Have a Medical Advance Directive? No No No No No No No  Would patient like information on creating a medical advance directive? No - Patient declined    No - Patient declined      Current Medications (verified) Outpatient Encounter Medications as of 10/09/2022  Medication Sig   acetaminophen (TYLENOL) 650 MG CR tablet Take 1,300 mg by mouth every 8 (eight) hours as needed for pain.   albuterol (VENTOLIN HFA) 108 (90 Base) MCG/ACT inhaler Inhale 2 puffs into the lungs every 6 (six) hours  as needed for wheezing or shortness of breath.   Biotin 5 MG TABS Take 1 tablet by mouth at bedtime.   calcium acetate (PHOSLO) 667 MG capsule Take 667 mg by mouth at bedtime.   clobetasol (TEMOVATE) 0.05 % external solution Apply 1 Application topically 2 (two) times daily.   Doxylamine Succinate, Sleep, (SLEEP AID PO) Take 1 tablet by mouth at bedtime.   InFLIXimab (REMICADE IV) Inject into the vein See admin instructions. Every 8 weeks at Dr. Shawnee Knapp office   metoprolol succinate (TOPROL-XL) 25 MG 24 hr tablet Take 1 tablet (25 mg total) by mouth daily.   pantoprazole (PROTONIX) 20 MG tablet Take 1 tablet (20 mg total) by mouth daily.   Polyethyl Glycol-Propyl Glycol (SYSTANE OP) Place 1 drop into both eyes daily as needed (dry eyes).   traMADol (ULTRAM) 50 MG tablet Take 1 tablet (50 mg total) by mouth at bedtime.   XARELTO 20 MG TABS tablet TAKE 1 TABLET BY MOUTH DAILY WITH SUPPER.   No facility-administered encounter medications on file as of 10/09/2022.    Allergies (verified) Methotrexate derivatives   History: Past Medical History:  Diagnosis Date   Allergy    Cataract    surgery to remove   Diverticulosis    Gallstones    GERD (gastroesophageal reflux disease)    Glaucoma    no med - just watching   Hepatitis    as a child   Hiatal  hernia    Osteoarthritis    Psoriatic arthritis    RA (rheumatoid arthritis)    Past Surgical History:  Procedure Laterality Date   BUNIONECTOMY Right    with correction   CESAREAN SECTION  10/21/79, 03/28/81   x 2   CHOLECYSTECTOMY N/A 04/12/2015   Procedure: LAPAROSCOPIC CHOLECYSTECTOMY;  Surgeon: Axel Filler, MD;  Location: [facility];  Service: General;  Laterality: N/A;   COLONOSCOPY  4/20006   [name]   DILATION AND CURETTAGE OF UTERUS     EYE SURGERY Bilateral    cataract surgery w/ lens implant   INTRAOCULAR LENS INSERTION Bilateral    TIBIA FRACTURE SURGERY Right 1986   Bone transplant   WISDOM TOOTH EXTRACTION      Family History  Problem Relation Age of Onset   Diabetes Mother    Heart attack Mother    Transient ischemic attack Mother    Diabetes Brother    Diabetes Maternal Grandmother    Colon cancer Neg Hx    Esophageal cancer Neg Hx    Stomach cancer Neg Hx    Social History   Socioeconomic History   Marital status: Married    Spouse name: Not on file   Number of children: 2   Years of education: Not on file   Highest education level: Not on file  Occupational History   Occupation: retired   Tobacco Use   Smoking status: Never   Smokeless tobacco: Never  Substance and Sexual Activity   Alcohol use: No    Alcohol/week: 0.0 standard drinks of alcohol   Drug use: No   Sexual activity: Not on file  Other Topics Concern   Not on file  Social History Narrative   Not on file   Social Determinants of Health   Financial Resource Strain: Low Risk  (10/09/2022)   Overall Financial Resource Strain (CARDIA)    Difficulty of Paying Living Expenses: Not hard at all  Food Insecurity: No Food Insecurity (10/09/2022)   Hunger Vital Sign    Worried About Running Out of Food in the Last Year: Never true    Ran Out of Food in the Last Year: Never true  Transportation Needs: No Transportation Needs (10/09/2022)   PRAPARE - Administrator, Civil Service (Medical): No    Lack of Transportation (Non-Medical): No  Physical Activity: Inactive (10/09/2022)   Exercise Vital Sign    Days of Exercise per Week: 0 days    Minutes of Exercise per Session: 0 min  Stress: No Stress Concern Present (10/09/2022)   [employer] of Occupational Health - Occupational Stress Questionnaire    Feeling of Stress : Not at all  Social Connections: Moderately Integrated (10/09/2022)   Social Connection and Isolation Panel [NHANES]    Frequency of Communication with Friends and Family: More than three times a week    Frequency of Social Gatherings with Friends and Family: More than three times a week     Attends Religious Services: More than 4 times per year    Active Member of [employer] or Organizations: Yes    Attends Banker Meetings: More than 4 times per year    Marital Status: Widowed    Tobacco Counseling Counseling given: Not Answered   Clinical Intake:  Pre-visit preparation completed: No  Pain : No/denies pain     BMI - recorded: 34.9 Nutritional Status: BMI > 30  Obese Nutritional Risks: None Diabetes: No  How often do you need to  have someone help you when you read instructions, pamphlets, or other written materials from your doctor or pharmacy?: 1 - Never  Diabetic?  No  Interpreter Needed?: No  Information entered by :: Theresa Mulligan LPN   Activities of Daily Living    10/09/2022    8:24 AM  In your present state of health, do you have any difficulty performing the following activities:  Hearing? 0  Vision? 0  Difficulty concentrating or making decisions? 0  Walking or climbing stairs? 0  Dressing or bathing? 0  Doing errands, shopping? 0  Preparing Food and eating ? N  Using the Toilet? N  In the past six months, have you accidently leaked urine? Y  Comment Wears breifs. Followed by PCP  Do you have problems with loss of bowel control? Y  Comment Followed by PCP  Managing your Medications? N  Managing your Finances? N  Housekeeping or managing your Housekeeping? N    Patient Care Team: Karie Georges, MD as PCP - General (Family Medicine) Croitoru, Rachelle Hora, MD as PCP - Cardiology (Cardiology)  Indicate any recent Medical Services you may have received from other than [facility] providers in the past year (date may be approximate).     Assessment:   This is a routine wellness examination for [facility].  Hearing/Vision screen Hearing Screening - Comments:: Wears rx glasses - up to date with routine eye exams with   Vision Screening - Comments:: Wears rx glasses - up to date with routine eye exams with  Dr Darel Hong  Dietary issues  and exercise activities discussed: Exercise limited by: None identified   Goals Addressed               This Visit's Progress     No current goals (pt-stated)         Depression Screen    10/09/2022    8:23 AM  PHQ 2/9 Scores  PHQ - 2 Score 0    Fall Risk    10/09/2022    8:26 AM 01/22/2018    3:47 PM  Fall Risk   Falls in the past year? 0 No  Comment  Emmi Telephone Survey: data to providers prior to load  Number falls in past yr: 0   Injury with Fall? 0   Risk for fall due to : No Fall Risks   Follow up Falls prevention discussed     FALL RISK PREVENTION PERTAINING TO THE HOME:  Any stairs in or around the home? Yes  If so, are there any without handrails? No  Home free of loose throw rugs in walkways, pet beds, electrical cords, etc? Yes  Adequate lighting in your home to reduce risk of falls? Yes   ASSISTIVE DEVICES UTILIZED TO PREVENT FALLS:  Life alert? No  Use of a cane, walker or w/c? No  Grab bars in the bathroom? Yes  Shower chair or bench in shower? Yes  Elevated toilet seat or a handicapped toilet? No   TIMED UP AND GO:  Was the test performed? No . Audio Visit    Cognitive Function:        10/09/2022    8:27 AM  6CIT Screen  What Year? 0 points  What month? 0 points  What time? 0 points  Count back from 20 0 points  Months in reverse 0 points  Repeat phrase 0 points  Total Score 0 points    Immunizations  There is no immunization history on file for this patient.  TDAP status: Due, Education has been provided regarding the importance of this vaccine. Advised may receive this vaccine at local pharmacy or Health Dept. Aware to provide a copy of the vaccination record if obtained from local pharmacy or Health Dept. Verbalized acceptance and understanding.  Flu Vaccine status: Up to date  Pneumococcal vaccine status: Due, Education has been provided regarding the importance of this vaccine. Advised may receive this vaccine at local  pharmacy or Health Dept. Aware to provide a copy of the vaccination record if obtained from local pharmacy or Health Dept. Verbalized acceptance and understanding.  Covid-19 vaccine status: Declined, Education has been provided regarding the importance of this vaccine but patient still declined. Advised may receive this vaccine at local pharmacy or Health Dept.or vaccine clinic. Aware to provide a copy of the vaccination record if obtained from local pharmacy or Health Dept. Verbalized acceptance and understanding.  Qualifies for Shingles Vaccine? Yes   Zostavax completed No   Shingrix Completed?: No.    Education has been provided regarding the importance of this vaccine. Patient has been advised to call insurance company to determine out of pocket expense if they have not yet received this vaccine. Advised may also receive vaccine at local pharmacy or Health Dept. Verbalized acceptance and understanding.  Screening Tests Health Maintenance  Topic Date Due   DTaP/Tdap/Td (1 - Tdap) Never done   COVID-19 Vaccine (1) 10/25/2022 (Originally 03/14/1946)   Zoster Vaccines- Shingrix (1 of 2) 11/18/2022 (Originally 09/12/1994)   Pneumonia Vaccine 26+ Years old (1 of 1 - PCV) 10/09/2023 (Originally 09/11/2009)   DEXA SCAN  10/09/2023 (Originally 09/11/2009)   Hepatitis C Screening  10/09/2023 (Originally 09/12/1963)   INFLUENZA VACCINE  01/23/2023   Medicare Annual Wellness (AWV)  10/09/2023   HPV VACCINES  Aged Out   COLONOSCOPY (Pts 45-60yrs Insurance coverage will need to be confirmed)  Discontinued    Health Maintenance  Health Maintenance Due  Topic Date Due   DTaP/Tdap/Td (1 - Tdap) Never done    Colorectal cancer screening: No longer required.   Mammogram status: No longer required due to Age.  Bone Density status: Ordered Deferred. Pt provided with contact info and advised to call to schedule appt.  Lung Cancer Screening: (Low Dose CT Chest recommended if Age 2-80 years, 30 pack-year  currently smoking OR have quit w/in 15years.) does not qualify.     Additional Screening:  Hepatitis C Screening: does qualify;  Deferred  Vision Screening: Recommended annual ophthalmology exams for early detection of glaucoma and other disorders of the eye. Is the patient up to date with their annual eye exam?  Yes  Who is the provider or what is the name of the office in which the patient attends annual eye exams? Dr Darel Hong If pt is not established with a provider, would they like to be referred to a provider to establish care? No .   Dental Screening: Recommended annual dental exams for proper oral hygiene  Community Resource Referral / Chronic Care Management:  CRR required this visit?  No   CCM required this visit?  No       Plan:     I have personally reviewed and noted the following in the patient's chart:   Medical and social history Use of alcohol, tobacco or illicit drugs  Current medications and supplements including opioid prescriptions. Patient is not currently taking opioid prescriptions. Functional ability and status Nutritional status Physical activity Advanced directives List of other physicians Hospitalizations, surgeries, and ER  visits in previous 12 months Vitals Screenings to include cognitive, depression, and falls Referrals and appointments  In addition, I have reviewed and discussed with patient certain preventive protocols, quality metrics, and best practice recommendations. A written personalized care plan for preventive services as well as general preventive health recommendations were provided to patient.     Phyllis Rung, LPN   1/61/0960   Nurse Notes: Patient due Hep-C Screening

## 2022-10-09 NOTE — Patient Instructions (Addendum)
Phyllis Austin , Thank you for taking time to come for your Medicare Wellness Visit. I appreciate your ongoing commitment to your health goals. Please review the following plan we discussed and let me know if I can assist you in the future.   These are the goals we discussed:  Goals       No current goals (pt-stated)        This is a list of the screening recommended for you and due dates:  Health Maintenance  Topic Date Due   DTaP/Tdap/Td vaccine (1 - Tdap) Never done   COVID-19 Vaccine (1) 10/25/2022*   Zoster (Shingles) Vaccine (1 of 2) 11/18/2022*   Pneumonia Vaccine (1 of 1 - PCV) 10/09/2023*   DEXA scan (bone density measurement)  10/09/2023*   Hepatitis C Screening: USPSTF Recommendation to screen - Ages 18-79 yo.  10/09/2023*   Flu Shot  01/23/2023   Medicare Annual Wellness Visit  10/09/2023   HPV Vaccine  Aged Out   Colon Cancer Screening  Discontinued  *Topic was postponed. The date shown is not the original due date.    Advanced directives: Advance directive discussed with you today. Even though you declined this today, please call our office should you change your mind, and we can give you the proper paperwork for you to fill out.   Conditions/risks identified: None  Next appointment: Follow up in one year for your annual wellness visit     Preventive Care 65 Years and Older, Female Preventive care refers to lifestyle choices and visits with your health care provider that can promote health and wellness. What does preventive care include? A yearly physical exam. This is also called an annual well check. Dental exams once or twice a year. Routine eye exams. Ask your health care provider how often you should have your eyes checked. Personal lifestyle choices, including: Daily care of your teeth and gums. Regular physical activity. Eating a healthy diet. Avoiding tobacco and drug use. Limiting alcohol use. Practicing safe sex. Taking low-dose aspirin every  day. Taking vitamin and mineral supplements as recommended by your health care provider. What happens during an annual well check? The services and screenings done by your health care provider during your annual well check will depend on your age, overall health, lifestyle risk factors, and family history of disease. Counseling  Your health care provider may ask you questions about your: Alcohol use. Tobacco use. Drug use. Emotional well-being. Home and relationship well-being. Sexual activity. Eating habits. History of falls. Memory and ability to understand (cognition). Work and work Astronomer. Reproductive health. Screening  You may have the following tests or measurements: Height, weight, and BMI. Blood pressure. Lipid and cholesterol levels. These may be checked every 5 years, or more frequently if you are over 55 years old. Skin check. Lung cancer screening. You may have this screening every year starting at age 56 if you have a 30-pack-year history of smoking and currently smoke or have quit within the past 15 years. Fecal occult blood test (FOBT) of the stool. You may have this test every year starting at age 15. Flexible sigmoidoscopy or colonoscopy. You may have a sigmoidoscopy every 5 years or a colonoscopy every 10 years starting at age 43. Hepatitis C blood test. Hepatitis B blood test. Sexually transmitted disease (STD) testing. Diabetes screening. This is done by checking your blood sugar (glucose) after you have not eaten for a while (fasting). You may have this done every 1-3 years. Bone density scan.  This is done to screen for osteoporosis. You may have this done starting at age 20. Mammogram. This may be done every 1-2 years. Talk to your health care provider about how often you should have regular mammograms. Talk with your health care provider about your test results, treatment options, and if necessary, the need for more tests. Vaccines  Your health care  provider may recommend certain vaccines, such as: Influenza vaccine. This is recommended every year. Tetanus, diphtheria, and acellular pertussis (Tdap, Td) vaccine. You may need a Td booster every 10 years. Zoster vaccine. You may need this after age 83. Pneumococcal 13-valent conjugate (PCV13) vaccine. One dose is recommended after age 78. Pneumococcal polysaccharide (PPSV23) vaccine. One dose is recommended after age 80. Talk to your health care provider about which screenings and vaccines you need and how often you need them. This information is not intended to replace advice given to you by your health care provider. Make sure you discuss any questions you have with your health care provider. Document Released: 07/07/2015 Document Revised: 02/28/2016 Document Reviewed: 04/11/2015 Elsevier Interactive Patient Education  2017 [place] Prevention in the Home Falls can cause injuries. They can happen to people of all ages. There are many things you can do to make your home safe and to help prevent falls. What can I do on the outside of my home? Regularly fix the edges of walkways and driveways and fix any cracks. Remove anything that might make you trip as you walk through a door, such as a raised step or threshold. Trim any bushes or trees on the path to your home. Use bright outdoor lighting. Clear any walking paths of anything that might make someone trip, such as rocks or tools. Regularly check to see if handrails are loose or broken. Make sure that both sides of any steps have handrails. Any raised decks and porches should have guardrails on the edges. Have any leaves, snow, or ice cleared regularly. Use sand or salt on walking paths during winter. Clean up any spills in your garage right away. This includes oil or grease spills. What can I do in the bathroom? Use night lights. Install grab bars by the toilet and in the tub and shower. Do not use towel bars as grab  bars. Use non-skid mats or decals in the tub or shower. If you need to sit down in the shower, use a plastic, non-slip stool. Keep the floor dry. Clean up any water that spills on the floor as soon as it happens. Remove soap buildup in the tub or shower regularly. Attach bath mats securely with double-sided non-slip rug tape. Do not have throw rugs and other things on the floor that can make you trip. What can I do in the bedroom? Use night lights. Make sure that you have a light by your bed that is easy to reach. Do not use any sheets or blankets that are too big for your bed. They should not hang down onto the floor. Have a firm chair that has side arms. You can use this for support while you get dressed. Do not have throw rugs and other things on the floor that can make you trip. What can I do in the kitchen? Clean up any spills right away. Avoid walking on wet floors. Keep items that you use a lot in easy-to-reach places. If you need to reach something above you, use a strong step stool that has a grab bar. Keep electrical cords  out of the way. Do not use floor polish or wax that makes floors slippery. If you must use wax, use non-skid floor wax. Do not have throw rugs and other things on the floor that can make you trip. What can I do with my stairs? Do not leave any items on the stairs. Make sure that there are handrails on both sides of the stairs and use them. Fix handrails that are broken or loose. Make sure that handrails are as long as the stairways. Check any carpeting to make sure that it is firmly attached to the stairs. Fix any carpet that is loose or worn. Avoid having throw rugs at the top or bottom of the stairs. If you do have throw rugs, attach them to the floor with carpet tape. Make sure that you have a light switch at the top of the stairs and the bottom of the stairs. If you do not have them, ask someone to add them for you. What else can I do to help prevent  falls? Wear shoes that: Do not have high heels. Have rubber bottoms. Are comfortable and fit you well. Are closed at the toe. Do not wear sandals. If you use a stepladder: Make sure that it is fully opened. Do not climb a closed stepladder. Make sure that both sides of the stepladder are locked into place. Ask someone to hold it for you, if possible. Clearly mark and make sure that you can see: Any grab bars or handrails. First and last steps. Where the edge of each step is. Use tools that help you move around (mobility aids) if they are needed. These include: Canes. Walkers. Scooters. Crutches. Turn on the lights when you go into a dark area. Replace any light bulbs as soon as they burn out. Set up your furniture so you have a clear path. Avoid moving your furniture around. If any of your floors are uneven, fix them. If there are any pets around you, be aware of where they are. Review your medicines with your doctor. Some medicines can make you feel dizzy. This can increase your chance of falling. Ask your doctor what other things that you can do to help prevent falls. This information is not intended to replace advice given to you by your health care provider. Make sure you discuss any questions you have with your health care provider. Document Released: 04/06/2009 Document Revised: 11/16/2015 Document Reviewed: 07/15/2014 Elsevier Interactive Patient Education  2017 [employer].

## 2022-10-14 ENCOUNTER — Other Ambulatory Visit (HOSPITAL_BASED_OUTPATIENT_CLINIC_OR_DEPARTMENT_OTHER): Payer: Self-pay | Admitting: Family

## 2022-10-14 DIAGNOSIS — R079 Chest pain, unspecified: Secondary | ICD-10-CM

## 2022-10-14 NOTE — Telephone Encounter (Signed)
Rx(s) sent to pharmacy electronically.  

## 2022-11-11 ENCOUNTER — Ambulatory Visit: Payer: Medicare PPO | Attending: Cardiovascular Disease | Admitting: Cardiovascular Disease

## 2022-11-11 ENCOUNTER — Encounter: Payer: Self-pay | Admitting: Cardiovascular Disease

## 2022-11-11 VITALS — BP 138/71 | HR 74 | Ht 63.0 in | Wt 200.6 lb

## 2022-11-11 DIAGNOSIS — M069 Rheumatoid arthritis, unspecified: Secondary | ICD-10-CM

## 2022-11-11 DIAGNOSIS — I2510 Atherosclerotic heart disease of native coronary artery without angina pectoris: Secondary | ICD-10-CM | POA: Diagnosis not present

## 2022-11-11 DIAGNOSIS — I872 Venous insufficiency (chronic) (peripheral): Secondary | ICD-10-CM | POA: Diagnosis not present

## 2022-11-11 DIAGNOSIS — I87009 Postthrombotic syndrome without complications of unspecified extremity: Secondary | ICD-10-CM

## 2022-11-11 DIAGNOSIS — R0602 Shortness of breath: Secondary | ICD-10-CM | POA: Diagnosis not present

## 2022-11-11 DIAGNOSIS — D6869 Other thrombophilia: Secondary | ICD-10-CM

## 2022-11-11 DIAGNOSIS — Z86718 Personal history of other venous thrombosis and embolism: Secondary | ICD-10-CM

## 2022-11-11 DIAGNOSIS — I700 Atherosclerosis of aorta: Secondary | ICD-10-CM

## 2022-11-11 NOTE — Progress Notes (Unsigned)
Cardiology Office Note:    Date:  11/14/2022   ID:  Phyllis Austin, DOB 04/24/1946, MRN [id number]  PCP:  Karie Georges, MD  Cardiologist:  Thurmon Fair, MD   Referring MD: No ref. provider found   Chief Complaint  Patient presents with   Leg Swelling   Leg Pain    History of Present Illness:    Phyllis Austin is a 77 y.o. 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  She has been compliant with anticoagulation with Xarelto since then.  She also has rheumatoid arthritis and plaque psoriasis and is on chronic treatment with Remicade.  Her biggest complaint is of occasional severe pain in her left calf.  She has chronic swelling in both legs due to varicose veins and has had a previous DVT on the left side.  Her most recent ultrasound study shows recanalized/partially obstructive organized thrombus in the left femoral and popliteal veins.  There was no evidence of a Baker's cyst, but there was some fluid in the left posterior popliteal space.  Has not had angina or severe shortness of breath recently.  For a long time she has had mild dyspnea with light activity.  She has not had any wheezing and has not used albuterol in a long time.  She denies orthopnea, PND, palpitations, dizziness or syncope.  She has not had any severe bleeding on Xarelto anticoagulation.  She denies falls or injuries  In 2022, had a CT angiogram of the chest that did not show any evidence of pulmonary embolism, although did show significant atherosclerosis in the aortic arch and a chronically elevated right hemidiaphragm.  There was evidence of thoracic spine degenerative changes, but there was no compression fracture.  Normal.  She also underwent an echocardiogram that showed normal regional wall motion and normal LVEF on Oct 23, 2020.  The BNP was very low at only 17.  Interestingly, her sed rate was elevated at 74.  She is on chronic Remicade therapy  for the last 10 years or so, followed by Dr. Dierdre Forth.  She has a family history of premature onset CAD: Her mother had a myocardial infarction at age 8 and died of a second infarction at age 50, her maternal grandmother died of a myocardial infarction at the age of 22.    Past Medical History:  Diagnosis Date   Allergy    Cataract    surgery to remove   Diverticulosis    Gallstones    GERD (gastroesophageal reflux disease)    Glaucoma    no med - just watching   Hepatitis    as a child   Hiatal hernia    Osteoarthritis    Psoriatic arthritis (HCC)    RA (rheumatoid arthritis) (HCC)     Past Surgical History:  Procedure Laterality Date   BUNIONECTOMY Right    with correction   CESAREAN SECTION  10/21/79, 03/28/81   x 2   CHOLECYSTECTOMY N/A 04/12/2015   Procedure: LAPAROSCOPIC CHOLECYSTECTOMY;  Surgeon: Axel Filler, MD;  Location: [facility] OR;  Service: General;  Laterality: N/A;   COLONOSCOPY  4/20006   [name]   DILATION AND CURETTAGE OF UTERUS     EYE SURGERY Bilateral    cataract surgery w/ lens implant   INTRAOCULAR LENS INSERTION Bilateral    TIBIA FRACTURE SURGERY Right 1986   Bone transplant   WISDOM TOOTH EXTRACTION      Current Medications: Current Meds  Medication Sig   acetaminophen (TYLENOL) 650 MG CR tablet Take 1,300 mg by mouth every 8 (eight) hours as needed for pain.   Biotin 5 MG TABS Take 1 tablet by mouth at bedtime.   calcium acetate (PHOSLO) 667 MG capsule Take 667 mg by mouth at bedtime.   clobetasol (TEMOVATE) 0.05 % external solution Apply 1 Application topically 2 (two) times daily.   InFLIXimab (REMICADE IV) Inject into the vein See admin instructions. Every 8 weeks at Dr. Shawnee Knapp office   metoprolol succinate (TOPROL-XL) 25 MG 24 hr tablet TAKE 1 TABLET (25 MG TOTAL) BY MOUTH DAILY.   pantoprazole (PROTONIX) 20 MG tablet Take 1 tablet (20 mg total) by mouth daily.   Polyethyl Glycol-Propyl Glycol (SYSTANE OP) Place 1 drop into both  eyes daily as needed (dry eyes).   traMADol (ULTRAM) 50 MG tablet Take 1 tablet (50 mg total) by mouth at bedtime.   XARELTO 20 MG TABS tablet TAKE 1 TABLET BY MOUTH DAILY WITH SUPPER.     Allergies:   Methotrexate derivatives   Social History   Socioeconomic History   Marital status: Married    Spouse name: Not on file   Number of children: 2   Years of education: Not on file   Highest education level: Not on file  Occupational History   Occupation: retired   Tobacco Use   Smoking status: Never   Smokeless tobacco: Never  Substance and Sexual Activity   Alcohol use: No    Alcohol/week: 0.0 standard drinks of alcohol   Drug use: No   Sexual activity: Not on file  Other Topics Concern   Not on file  Social History Narrative   Not on file   Social Determinants of Health   Financial Resource Strain: Low Risk  (10/09/2022)   Overall Financial Resource Strain (CARDIA)    Difficulty of Paying Living Expenses: Not hard at all  Food Insecurity: No Food Insecurity (10/09/2022)   Hunger Vital Sign    Worried About Running Out of Food in the Last Year: Never true    Ran Out of Food in the Last Year: Never true  Transportation Needs: No Transportation Needs (10/09/2022)   PRAPARE - Administrator, Civil Service (Medical): No    Lack of Transportation (Non-Medical): No  Physical Activity: Inactive (10/09/2022)   Exercise Vital Sign    Days of Exercise per Week: 0 days    Minutes of Exercise per Session: 0 min  Stress: No Stress Concern Present (10/09/2022)   [employer] of Occupational Health - Occupational Stress Questionnaire    Feeling of Stress : Not at all  Social Connections: Moderately Integrated (10/09/2022)   Social Connection and Isolation Panel [NHANES]    Frequency of Communication with Friends and Family: More than three times a week    Frequency of Social Gatherings with Friends and Family: More than three times a week    Attends Religious Services:  More than 4 times per year    Active Member of [employer] or Organizations: Yes    Attends Banker Meetings: More than 4 times per year    Marital Status: Widowed     Family History: The patient's family history includes Diabetes in her brother, maternal grandmother, and mother; Heart attack in her mother; Transient ischemic attack in her mother. There is no history of Colon cancer, Esophageal cancer, or Stomach cancer.  ROS:   Please see the history of present illness.  All other systems are reviewed and are negative.   EKGs/Labs/Other Studies Reviewed:    The following studies were reviewed today: Venous duplex ultrasound  08/09/2022 Summary:  RIGHT:  - No evidence of common femoral vein obstruction.    LEFT:  - There is recanalized thrombus in the left femoral and popliteal vein(s).  - Findings consistent with chronic deep vein thrombosis involving the left  femoral vein, and left popliteal vein.  - No cystic structure found in the popliteal fossa.  - Moderate fluid in left posterior popliteal joint space, 3.54 cm length x  0.88 cm width.     EKG:  EKG is ordered today.  it shows normal sinus rhythm and is a normal tracing.  QTc 417 ms  Recent Labs: No results found for requested labs within last 365 days.  Recent Lipid Panel No results found for: "CHOL", "TRIG", "HDL", "CHOLHDL", "VLDL", "LDLCALC", "LDLDIRECT"  Physical Exam:    VS:  BP 138/71   Pulse 74   Ht 5\' 3"  (1.6 m)   Wt 200 lb 9.6 oz (91 kg)   SpO2 96%   BMI 35.53 kg/m     Wt Readings from Last 3 Encounters:  11/11/22 200 lb 9.6 oz (91 kg)  10/09/22 197 lb (89.4 kg)  08/20/22 197 lb (89.4 kg)     General: Alert, oriented x3, no distress, moderately obese Head: no evidence of trauma, PERRL, EOMI, no exophtalmos or lid lag, no myxedema, no xanthelasma; normal ears, nose and oropharynx Neck: normal jugular venous pulsations and no hepatojugular reflux; brisk carotid pulses without delay and  no carotid bruits Chest: clear to auscultation, no signs of consolidation by percussion or palpation, normal fremitus, symmetrical and full respiratory excursions Cardiovascular: normal position and quality of the apical impulse, regular rhythm, normal first and second heart sounds, no murmurs, rubs or gallops Abdomen: no tenderness or distention, no masses by palpation, no abnormal pulsatility or arterial bruits, normal bowel sounds, no hepatosplenomegaly Extremities:  prominent bilateral lower extremity varicose veins involving the calfs and thighs.  2+ bilateral edema of the ankles and feet. Neurological: grossly nonfocal Psych: Normal mood and affect      ASSESSMENT:    1. Shortness of breath   2. Coronary artery calcification seen on CT scan   3. Atherosclerosis of aorta (HCC)   4. Peripheral venous insufficiency   5. History of DVT (deep vein thrombosis)   6. Acquired thrombophilia (HCC)   7. Rheumatoid arthritis involving multiple sites, unspecified whether rheumatoid factor present (HCC)   8. Postphlebitic syndrome     PLAN:    In order of problems listed above:  Dyspnea: Seems to be at baseline and was not a prominent complaint today.  Probably multifactorial, with a history of reactive airway disease and moderate obesity playing a role.  Cannot entirely exclude contribution of heart disease.  Coronary artery calcifications seen on previous CT of the chest, but normal Lexiscan Myoview without ischemia or infarction in June 2022.  EF was borderline at 53% on the nuclear scintigraphy study but was normal on the echocardiogram.  Diastolic parameters were only mildly abnormal, especially when taking her age into account and the E/e' ratio did not support elevated left heart filling pressures. Aortic atherosclerosis: Reports having had a lipid profile with PCP.  Would want to take a look at those numbers.  Target LDL pressure less than 70, at least shoot for less than 100. History  of DVT: She may have mild postphlebitic syndrome of  the right lower extremity which explains her discomfort, but her edema is actually quite symmetrical.  She has had problems with peripheral venous insufficiency that predate the DVT.  She does not have any stasis ulcers.  There are very prominent superficial varicose veins. Continue to recommend compression stockings and leg elevation. Xarelto: Well-tolerated without bleeding complications.   RA: She does not take NSAIDs.  On Remicade.  This may explain her elevated ESR.    Medication Adjustments/Labs and Tests Ordered: Current medicines are reviewed at length with the patient today.  Concerns regarding medicines are outlined above.  No orders of the defined types were placed in this encounter.  No orders of the defined types were placed in this encounter.   Patient Instructions  Medication Instructions:  No changes *If you need a refill on your cardiac medications before your next appointment, please call your pharmacy*  Follow-Up: At [facility], you and your health needs are our priority.  As part of our continuing mission to provide you with exceptional heart care, we have created designated Provider Care Teams.  These Care Teams include your primary Cardiologist (physician) and Advanced Practice Providers (APPs -  Physician Assistants and Nurse Practitioners) who all work together to provide you with the care you need, when you need it.  We recommend signing up for the patient portal called "MyChart".  Sign up information is provided on this After Visit Summary.  MyChart is used to connect with patients for Virtual Visits (Telemedicine).  Patients are able to view lab/test results, encounter notes, upcoming appointments, etc.  Non-urgent messages can be sent to your provider as well.   To learn more about what you can do with MyChart, go to [email or web address].    Your next appointment:    1 year  Provider:   Thurmon Fair, MD     Other Instructions [employer] for Moderate Support Compression Stockings    Signed, Thurmon Fair, MD  11/14/2022 2:43 PM    [facility]

## 2022-11-11 NOTE — Patient Instructions (Signed)
Medication Instructions:  No changes *If you need a refill on your cardiac medications before your next appointment, please call your pharmacy*  Follow-Up: At [facility], you and your health needs are our priority.  As part of our continuing mission to provide you with exceptional heart care, we have created designated Provider Care Teams.  These Care Teams include your primary Cardiologist (physician) and Advanced Practice Providers (APPs -  Physician Assistants and Nurse Practitioners) who all work together to provide you with the care you need, when you need it.  We recommend signing up for the patient portal called "MyChart".  Sign up information is provided on this After Visit Summary.  MyChart is used to connect with patients for Virtual Visits (Telemedicine).  Patients are able to view lab/test results, encounter notes, upcoming appointments, etc.  Non-urgent messages can be sent to your provider as well.   To learn more about what you can do with MyChart, go to [email or web address].    Your next appointment:    1 year  Provider:   Thurmon Fair, MD     Other Instructions [employer] for Moderate Support Compression Stockings

## 2022-11-14 ENCOUNTER — Encounter: Payer: Self-pay | Admitting: Cardiovascular Disease

## 2022-11-14 DIAGNOSIS — I2510 Atherosclerotic heart disease of native coronary artery without angina pectoris: Secondary | ICD-10-CM | POA: Insufficient documentation

## 2022-11-14 DIAGNOSIS — I700 Atherosclerosis of aorta: Secondary | ICD-10-CM | POA: Insufficient documentation

## 2022-11-15 NOTE — Addendum Note (Signed)
Addended by: Derenda Fennel on: 11/15/2022 08:56 AM   Modules accepted: Orders

## 2022-11-20 ENCOUNTER — Encounter: Payer: Medicare PPO | Admitting: Family Medicine

## 2022-12-05 ENCOUNTER — Ambulatory Visit (INDEPENDENT_AMBULATORY_CARE_PROVIDER_SITE_OTHER): Payer: Medicare PPO | Admitting: Family Medicine

## 2022-12-05 ENCOUNTER — Encounter: Payer: Self-pay | Admitting: Family Medicine

## 2022-12-05 ENCOUNTER — Ambulatory Visit (INDEPENDENT_AMBULATORY_CARE_PROVIDER_SITE_OTHER)
Admission: RE | Admit: 2022-12-05 | Discharge: 2022-12-05 | Disposition: A | Discharge status: Home / Self Care | Payer: Medicare PPO | Source: Ambulatory Visit | Attending: Family Medicine | Admitting: Family Medicine

## 2022-12-05 VITALS — BP 138/80 | HR 90 | Temp 98.2°F | Ht 63.0 in | Wt 201.0 lb

## 2022-12-05 DIAGNOSIS — M069 Rheumatoid arthritis, unspecified: Secondary | ICD-10-CM

## 2022-12-05 DIAGNOSIS — Z0000 Encounter for general adult medical examination without abnormal findings: Secondary | ICD-10-CM

## 2022-12-05 DIAGNOSIS — Z780 Asymptomatic menopausal state: Secondary | ICD-10-CM

## 2022-12-05 DIAGNOSIS — E669 Obesity, unspecified: Secondary | ICD-10-CM

## 2022-12-05 DIAGNOSIS — I2510 Atherosclerotic heart disease of native coronary artery without angina pectoris: Secondary | ICD-10-CM

## 2022-12-05 LAB — LIPID PANEL
Cholesterol: 178 mg/dL (ref 0–200)
HDL: 48.1 mg/dL (ref >39.00)
LDL Cholesterol: 93 mg/dL (ref 0–99)
NonHDL: 129.55
Total CHOL/HDL Ratio: 4
Triglycerides: 183 mg/dL — ABNORMAL HIGH (ref 0.0–149.0)
VLDL: 36.6 mg/dL (ref 0.0–40.0)

## 2022-12-05 LAB — COMPREHENSIVE METABOLIC PANEL
ALT: 13 U/L (ref 0–35)
AST: 23 U/L (ref 0–37)
Albumin: 4.2 g/dL (ref 3.5–5.2)
Alkaline Phosphatase: 55 U/L (ref 39–117)
BUN: 16 mg/dL (ref 6–23)
CO2: 30 mEq/L (ref 19–32)
Calcium: 9.4 mg/dL (ref 8.4–10.5)
Chloride: 102 mEq/L (ref 96–112)
Creatinine, Ser: 0.72 mg/dL (ref 0.40–1.20)
GFR: 80.19 mL/min (ref >60.00)
Glucose, Bld: 105 mg/dL — ABNORMAL HIGH (ref 70–99)
Potassium: 4.2 mEq/L (ref 3.5–5.1)
Sodium: 140 mEq/L (ref 135–145)
Total Bilirubin: 0.8 mg/dL (ref 0.2–1.2)
Total Protein: 7.9 g/dL (ref 6.0–8.3)

## 2022-12-05 LAB — CBC WITH DIFFERENTIAL/PLATELET
Basophils Absolute: 0.1 10*3/uL (ref 0.0–0.1)
Basophils Relative: 1.2 % (ref 0.0–3.0)
Eosinophils Absolute: 0.2 10*3/uL (ref 0.0–0.7)
Eosinophils Relative: 2.7 % (ref 0.0–5.0)
HCT: 42.3 % (ref 36.0–46.0)
Hemoglobin: 13.4 g/dL (ref 12.0–15.0)
Lymphocytes Relative: 44 % (ref 12.0–46.0)
Lymphs Abs: 2.7 10*3/uL (ref 0.7–4.0)
MCHC: 31.7 g/dL (ref 30.0–36.0)
MCV: 94.7 fl (ref 78.0–100.0)
Monocytes Absolute: 0.7 10*3/uL (ref 0.1–1.0)
Monocytes Relative: 11 % (ref 3.0–12.0)
Neutro Abs: 2.5 10*3/uL (ref 1.4–7.7)
Neutrophils Relative %: 41.1 % — ABNORMAL LOW (ref 43.0–77.0)
Platelets: 253 10*3/uL (ref 150.0–400.0)
RBC: 4.47 Mil/uL (ref 3.87–5.11)
RDW: 13.6 % (ref 11.5–15.5)
WBC: 6.1 10*3/uL (ref 4.0–10.5)

## 2022-12-05 LAB — TSH: TSH: 2.56 u[IU]/mL (ref 0.35–5.50)

## 2022-12-05 MED ORDER — TRAMADOL HCL 50 MG PO TABS
50.0000 mg | ORAL_TABLET | Freq: Three times a day (TID) | ORAL | 2 refills | Status: DC | PRN
Start: 2022-12-05 — End: 2023-03-03

## 2022-12-05 NOTE — Progress Notes (Signed)
Complete physical exam  Patient: Phyllis Austin   DOB: 02-Mar-1946   77 y.o. Female  MRN: [id number]  Subjective:    Chief Complaint  Patient presents with   Annual Exam    Phyllis Austin is a 77 y.o. female who presents today for a complete physical exam. She reports consuming a general diet. Exercise is limited by orthopedic condition(s): rheumatoid arthritis with joint pain. She generally feels well. She reports sleeping well. She does not have additional problems to discuss today.    Most recent fall risk assessment:    12/05/2022    9:30 AM  Fall Risk   Falls in the past year? 0  Number falls in past yr: 0  Injury with Fall? 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   Obesity (BMI 30-39.9) 08/22/2022   Chronic diarrhea 08/22/2022   Polyuria 08/22/2022   Popliteal cyst, left 08/22/2022   Rheumatoid arthritis involving multiple sites (HCC) 07/02/2018   Peripheral venous insufficiency 07/02/2018   Long term current use of anticoagulant 07/02/2018   History of DVT (deep vein thrombosis) 07/02/2018   Left leg pain 07/23/2016   Right ureteral stone 05/20/2016   Abdominal pain, epigastric 03/23/2015   Cholelithiasis 03/23/2015   Hiatal hernia 03/23/2015      Patient Care Team: Karie Georges, MD as PCP - General (Family Medicine) Croitoru, Rachelle Hora, MD as PCP - Cardiology (Cardiology)   Outpatient Medications Prior to Visit  Medication Sig   acetaminophen (TYLENOL) 650 MG CR tablet Take 1,300 mg by mouth every 8 (eight) hours as needed for  pain.   albuterol (VENTOLIN HFA) 108 (90 Base) MCG/ACT inhaler Inhale 2 puffs into the lungs every 6 (six) hours as needed for wheezing or shortness of breath.   Biotin 5 MG TABS Take 1 tablet by mouth at bedtime.   calcium acetate (PHOSLO) 667 MG capsule Take 667 mg by mouth at bedtime.   clobetasol (TEMOVATE) 0.05 % external solution Apply 1 Application topically 2 (two) times daily.   Doxylamine Succinate, Sleep, (SLEEP AID PO) Take 1 tablet by mouth at bedtime.   InFLIXimab (REMICADE IV) Inject into the vein See admin instructions. Every 8 weeks at Dr. Shawnee Knapp office   metoprolol succinate (TOPROL-XL) 25 MG 24 hr tablet TAKE 1 TABLET (25 MG TOTAL) BY MOUTH DAILY.   OVER THE COUNTER MEDICATION OTC anti-diarrheal-2 tablets every 3 days   pantoprazole (PROTONIX) 20 MG tablet Take 1 tablet (20 mg total) by mouth daily.   Polyethyl Glycol-Propyl Glycol (SYSTANE OP) Place 1 drop into both eyes daily as needed (dry eyes).   XARELTO 20 MG TABS tablet TAKE 1 TABLET BY MOUTH DAILY WITH SUPPER.   [DISCONTINUED] traMADol (ULTRAM) 50 MG tablet Take 1 tablet (50 mg total) by mouth at bedtime.   No facility-administered medications prior to visit.    Review of Systems  HENT:  Negative for hearing loss.   Eyes:  Negative for blurred vision.  Respiratory:  Negative for shortness of  breath.   Cardiovascular:  Negative for chest pain.  Gastrointestinal: Negative.   Genitourinary: Negative.   Musculoskeletal:  Negative for back pain.  Neurological:  Negative for headaches.  Psychiatric/Behavioral:  Negative for depression.        Objective:     BP 138/80 (BP Location: Right Arm, Patient Position: Sitting, Cuff Size: Large)   Pulse 90   Temp 98.2 F (36.8 C) (Oral)   Ht 5\' 3"  (1.6 m)   Wt 201 lb (91.2 kg)   SpO2 95%   BMI 35.61 kg/m    Physical Exam Vitals reviewed.  Constitutional:      Appearance: Normal appearance. She is well-groomed. She is obese.  HENT:     Right Ear: Tympanic  membrane and ear canal normal.     Left Ear: Tympanic membrane and ear canal normal.     Mouth/Throat:     Mouth: Mucous membranes are moist.     Pharynx: No posterior oropharyngeal erythema.  Eyes:     Conjunctiva/sclera: Conjunctivae normal.  Neck:     Thyroid: No thyromegaly.  Cardiovascular:     Rate and Rhythm: Normal rate and regular rhythm.     Pulses: Normal pulses.     Heart sounds: S1 normal and S2 normal.  Pulmonary:     Effort: Pulmonary effort is normal.     Breath sounds: Normal breath sounds and air entry.  Abdominal:     General: Bowel sounds are normal.  Musculoskeletal:     Right lower leg: No edema.     Left lower leg: No edema.  Lymphadenopathy:     Cervical: No cervical adenopathy.  Neurological:     Mental Status: She is alert and oriented to person, place, and time. Mental status is at baseline.     Gait: Gait is intact.  Psychiatric:        Mood and Affect: Mood and affect normal.        Speech: Speech normal.        Behavior: Behavior normal.        Judgment: Judgment normal.      No results found for any visits on 12/05/22.     Assessment & Plan:    Routine Health Maintenance and Physical Exam   There is no immunization history on file for this patient.  Health Maintenance  Topic Date Due   DTaP/Tdap/Td (1 - Tdap) Never done   Zoster Vaccines- Shingrix (1 of 2) Never done   COVID-19 Vaccine (1) 12/21/2022 (Originally 09/11/1950)   Pneumonia Vaccine 58+ Years old (1 of 1 - PCV) 10/09/2023 (Originally 09/11/2009)   DEXA SCAN  10/09/2023 (Originally 09/11/2009)   Hepatitis C Screening  10/09/2023 (Originally 09/12/1962)   INFLUENZA VACCINE  01/23/2023   Medicare Annual Wellness (AWV)  10/09/2023   HPV VACCINES  Aged Out   Colonoscopy  Discontinued    Discussed health benefits of physical activity, and encouraged her to engage in regular exercise appropriate for her age and condition.  Routine general medical examination at a health care  facility  Normal physical exam findings today. Handouts and counseling given on increasing physical activity   Obesity (BMI 30-39.9) -     TSH -     Lipid panel -     Comprehensive metabolic panel  Rheumatoid arthritis involving multiple sites, unspecified whether rheumatoid factor present (HCC) -     CBC with Differential/Platelet -     traMADol HCl; Take 1 tablet (50 mg total) by mouth  every 8 (eight) hours as needed.  Dispense: 90 tablet; Refill: 2  Postmenopausal state -     DG Bone Density; Future    Return in about 6 months (around 06/06/2023) for medication refills.     Karie Georges, MD

## 2022-12-05 NOTE — Patient Instructions (Signed)

## 2022-12-06 MED ORDER — ATORVASTATIN CALCIUM 20 MG PO TABS
20.0000 mg | ORAL_TABLET | Freq: Every day | ORAL | 3 refills | Status: DC
Start: 2022-12-06 — End: 2023-12-04

## 2022-12-06 NOTE — Addendum Note (Signed)
Addended by: Karie Georges on: 12/06/2022 09:18 AM   Modules accepted: Orders

## 2023-02-27 ENCOUNTER — Other Ambulatory Visit: Payer: Self-pay | Admitting: Cardiovascular Disease

## 2023-02-27 ENCOUNTER — Other Ambulatory Visit: Payer: Self-pay | Admitting: Family Medicine

## 2023-02-27 DIAGNOSIS — Z86718 Personal history of other venous thrombosis and embolism: Secondary | ICD-10-CM

## 2023-02-27 DIAGNOSIS — M069 Rheumatoid arthritis, unspecified: Secondary | ICD-10-CM

## 2023-02-27 NOTE — Telephone Encounter (Signed)
Prescription refill request for Xarelto received.  Indication: DVT Last office visit: 11/11/22  Judie Petit Croitoru MD Weight: 91kg Age: 77 Scr: 0.72 on 12/05/22  Epic CrCl: 92.51  Based on above findings Xarelto 20mg  daily is the appropriate dose.  Refill approved.

## 2023-03-19 DIAGNOSIS — L4050 Arthropathic psoriasis, unspecified: Secondary | ICD-10-CM | POA: Diagnosis not present

## 2023-05-14 DIAGNOSIS — L4050 Arthropathic psoriasis, unspecified: Secondary | ICD-10-CM | POA: Diagnosis not present

## 2023-05-30 ENCOUNTER — Telehealth: Payer: Self-pay | Admitting: Cardiovascular Disease

## 2023-05-30 NOTE — Telephone Encounter (Signed)
   [facility] Pre-operative Risk Assessment    Request for surgical clearance:  What type of surgery is being performed?  Dental extraction of #14   When is this surgery scheduled?     What type of clearance is required (medical clearance vs. Pharmacy clearance to hold med vs. Both)?  Both   Are there any medications that need to be held prior to surgery and how long? Please advise on holding Xarelto.   Practice name and name of physician performing surgery?  [facility]. Dr. Egbert Garibaldi    What is your office phone number? [phone]    7.   What is your office fax number [phone]  8.   Anesthesia type (None, local, MAC, general)? IV Sedation   Rolly Pancake 05/30/2023, 11:23 AM

## 2023-05-30 NOTE — Telephone Encounter (Signed)
   Patient Name: Phyllis Austin  DOB: March 19, 1946 MRN: [id number]  Primary Cardiologist: Thurmon Fair, MD  Chart reviewed as part of pre-operative protocol coverage.   Dental extractions (i.e. 1-2 teeth) are considered low risk procedures per guidelines and generally do not require any specific cardiac clearance. It is also generally accepted that for simple extractions and dental cleanings, there is no need to interrupt blood thinner therapy.  She is on xarelto for hx of DVT, which we do not manage. If further questions, please reach out to PCP.   SBE prophylaxis is not required for the patient from a cardiac standpoint.  I will route this recommendation to the requesting party via Epic fax function and remove from pre-op pool.  Please call with questions.  Roe Rutherford Patriece Archbold, PA 05/30/2023, 11:37 AM

## 2023-06-04 ENCOUNTER — Ambulatory Visit: Payer: Medicare PPO | Admitting: Family Medicine

## 2023-06-04 ENCOUNTER — Encounter: Payer: Self-pay | Admitting: Family Medicine

## 2023-06-04 VITALS — BP 138/72 | HR 70 | Temp 98.0°F | Ht 63.0 in | Wt 189.3 lb

## 2023-06-04 DIAGNOSIS — Z23 Encounter for immunization: Secondary | ICD-10-CM

## 2023-06-04 DIAGNOSIS — M069 Rheumatoid arthritis, unspecified: Secondary | ICD-10-CM | POA: Diagnosis not present

## 2023-06-04 DIAGNOSIS — I2510 Atherosclerotic heart disease of native coronary artery without angina pectoris: Secondary | ICD-10-CM | POA: Diagnosis not present

## 2023-06-04 DIAGNOSIS — R8290 Unspecified abnormal findings in urine: Secondary | ICD-10-CM

## 2023-06-04 LAB — POC URINALSYSI DIPSTICK (AUTOMATED)
Bilirubin, UA: NEGATIVE
Glucose, UA: NEGATIVE
Ketones, UA: NEGATIVE
Leukocytes, UA: NEGATIVE
Nitrite, UA: NEGATIVE
Protein, UA: POSITIVE — AB
Spec Grav, UA: >=1.03 — AB (ref 1.010–1.025)
Urobilinogen, UA: 0.2 U/dL
pH, UA: 5.5 (ref 5.0–8.0)

## 2023-06-04 MED ORDER — TRAMADOL HCL 50 MG PO TABS
50.0000 mg | ORAL_TABLET | Freq: Every evening | ORAL | 0 refills | Status: DC | PRN
Start: 2023-06-04 — End: 2023-10-28

## 2023-06-04 MED ORDER — METOPROLOL SUCCINATE ER 25 MG PO TB24: 25.0000 mg | ORAL_TABLET | Freq: Every day | ORAL | 1 refills | Status: DC

## 2023-06-04 NOTE — Assessment & Plan Note (Signed)
10 year ASCVD risk is >20%, benefit of continuing atorvastatin outweighs the risk currently, will continue as prescribed. Checking CMP and new lipid panel today for medication monitoring.

## 2023-06-04 NOTE — Progress Notes (Signed)
Established Patient Office Visit  Subjective   Patient ID: Phyllis Austin, female    DOB: 11/25/1945  Age: 77 y.o. MRN: [id number]  Chief Complaint  Patient presents with   Medical Management of Chronic Issues   patient complains of odorous urine for years    Pt is here for 6 month follow up today.  Patient reports that she has had "malodorous" urine that has been going for years. States that her urine smells very strongly, states that it sometimes burns when it touches her skin on the outside when it comes out. States that she sometimes has bladder leakage and she washes down there often. States this past week she has been on penicillin for a bad tooth, is having an extraction on Friday. She denies any fever/chills, no pelvic pain, no bladder spasms, etc. States that she has been in a lot of pain due to this tooth.   RA-- pt needs refills on her tramadol pain medication today. She reports that she takes it about 1-2 times per day as needed when she is having pain from her joints. Reports that it is working well to help control her pain.   Coronary calcification on CT-- pt has been on atorvastatin 20 mg daily since April. She reports that the chronic diarrhea might be a little worse since starting the medication. Pt states that she has a strong family history of cardiac disease. We discussed the risks/benefits of statin therapy in the reduction of cardiac risk.   Chronic diarrhea- pt takes loperamide daily as needed for this problem, has been going on for many years, see above.    Current Outpatient Medications  Medication Instructions   acetaminophen (TYLENOL) 1,300 mg, Oral, Every 8 hours PRN   atorvastatin (LIPITOR) 20 mg, Oral, Daily   Biotin 5 MG TABS 1 tablet, Oral, Daily at bedtime   calcium acetate (PHOSLO) 667 mg, Oral, Daily at bedtime   clobetasol (TEMOVATE) 0.05 % external solution 1 Application, Topical, 2 times daily   Doxylamine Succinate, Sleep, (SLEEP AID PO) 1  tablet, Oral, Daily at bedtime   InFLIXimab (REMICADE IV) Intravenous, See admin instructions, Every 8 weeks at Dr. Shawnee Knapp office   metoprolol succinate (TOPROL-XL) 25 mg, Oral, Daily   OVER THE COUNTER MEDICATION OTC anti-diarrheal-2 tablets every 3 days   pantoprazole (PROTONIX) 20 mg, Oral, Daily   Polyethyl Glycol-Propyl Glycol (SYSTANE OP) 1 drop, Both Eyes, Daily PRN   traMADol (ULTRAM) 50 mg, Oral, At bedtime PRN   Xarelto 20 mg, Oral, Daily with supper      Review of Systems  All other systems reviewed and are negative.     Objective:     BP 138/72 (BP Location: Right Arm, Patient Position: Sitting, Cuff Size: Large)   Pulse 70   Temp 98 F (36.7 C) (Oral)   Ht 5\' 3"  (1.6 m)   Wt 189 lb 4.8 oz (85.9 kg)   SpO2 98%   BMI 33.53 kg/m    Physical Exam Constitutional:      Appearance: Normal appearance. She is obese.  Cardiovascular:     Rate and Rhythm: Normal rate and regular rhythm.     Heart sounds: Normal heart sounds. No murmur heard. Pulmonary:     Effort: Pulmonary effort is normal.     Breath sounds: Normal breath sounds. No wheezing.  Abdominal:     General: Bowel sounds are normal.  Neurological:     Mental Status: She is alert and oriented to person,  place, and time. Mental status is at baseline.  Psychiatric:        Mood and Affect: Mood normal.        Behavior: Behavior normal.      Results for orders placed or performed in visit on 06/04/23  POCT Urinalysis Dipstick (Automated)  Result Value Ref Range   Color, UA yellow    Clarity, UA clear    Glucose, UA Negative Negative   Bilirubin, UA negative    Ketones, UA negative    Spec Grav, UA >=1.030 (A) 1.010 - 1.025   Blood, UA small    pH, UA 5.5 5.0 - 8.0   Protein, UA Positive (A) Negative   Urobilinogen, UA 0.2 0.2 or 1.0 E.U./dL   Nitrite, UA negative    Leukocytes, UA Negative Negative      The 10-year ASCVD risk score (Arnett DK, et al., 2019) is: 24.5%    Assessment &  Plan:  Abnormal urine odor Small blood and it also looks more concentrated. No signs of infection. Pt reports she has had this small blood in her urine for a long time. Will continue to monitor this at each visit.   -     POCT Urinalysis Dipstick (Automated)  Rheumatoid arthritis involving multiple sites, unspecified whether rheumatoid factor present [facility]) Assessment & Plan: On remicade injections monthly with her rheumatologist. I will treat her pain with PRN tramadol 50 mg daily. Patient cannot take NSAIDS at this time due to blood thinner use. Will continue tramadol since it is working well for her.   Orders: -     traMADol HCl; Take 1 tablet (50 mg total) by mouth at bedtime as needed.  Dispense: 90 tablet; Refill: 0  Coronary artery calcification seen on CT scan Assessment & Plan: 10 year ASCVD risk is >20%, benefit of continuing atorvastatin outweighs the risk currently, will continue as prescribed. Checking CMP and new lipid panel today for medication monitoring.   Orders: -     POCT Urinalysis Dipstick (Automated) -     Metoprolol Succinate ER; Take 1 tablet (25 mg total) by mouth daily.  Dispense: 90 tablet; Refill: 1 -     Lipid panel -     Comprehensive metabolic panel  Need for immunization against influenza -     Flu Vaccine Trivalent High Dose (Fluad)  Need for prophylactic vaccination against Streptococcus pneumoniae (pneumococcus) -     Pneumococcal conjugate vaccine 20-valent     Return in about 6 months (around 12/03/2023) for annual physical exam.    Karie Georges, MD

## 2023-06-04 NOTE — Assessment & Plan Note (Signed)
On remicade injections monthly with her rheumatologist. I will treat her pain with PRN tramadol 50 mg daily. Patient cannot take NSAIDS at this time due to blood thinner use. Will continue tramadol since it is working well for her.

## 2023-07-09 DIAGNOSIS — Z111 Encounter for screening for respiratory tuberculosis: Secondary | ICD-10-CM | POA: Diagnosis not present

## 2023-07-09 DIAGNOSIS — R5383 Other fatigue: Secondary | ICD-10-CM | POA: Diagnosis not present

## 2023-07-09 DIAGNOSIS — Z79899 Other long term (current) drug therapy: Secondary | ICD-10-CM | POA: Diagnosis not present

## 2023-07-09 DIAGNOSIS — L4050 Arthropathic psoriasis, unspecified: Secondary | ICD-10-CM | POA: Diagnosis not present

## 2023-09-03 DIAGNOSIS — Z79899 Other long term (current) drug therapy: Secondary | ICD-10-CM | POA: Diagnosis not present

## 2023-09-03 DIAGNOSIS — L4050 Arthropathic psoriasis, unspecified: Secondary | ICD-10-CM | POA: Diagnosis not present

## 2023-09-03 DIAGNOSIS — Z111 Encounter for screening for respiratory tuberculosis: Secondary | ICD-10-CM | POA: Diagnosis not present

## 2023-09-17 DIAGNOSIS — M1991 Primary osteoarthritis, unspecified site: Secondary | ICD-10-CM | POA: Diagnosis not present

## 2023-09-17 DIAGNOSIS — L4050 Arthropathic psoriasis, unspecified: Secondary | ICD-10-CM | POA: Diagnosis not present

## 2023-09-17 DIAGNOSIS — Z6833 Body mass index (BMI) 33.0-33.9, adult: Secondary | ICD-10-CM | POA: Diagnosis not present

## 2023-09-17 DIAGNOSIS — K219 Gastro-esophageal reflux disease without esophagitis: Secondary | ICD-10-CM | POA: Diagnosis not present

## 2023-09-17 DIAGNOSIS — Z79899 Other long term (current) drug therapy: Secondary | ICD-10-CM | POA: Diagnosis not present

## 2023-09-17 DIAGNOSIS — M79605 Pain in left leg: Secondary | ICD-10-CM | POA: Diagnosis not present

## 2023-09-17 DIAGNOSIS — L409 Psoriasis, unspecified: Secondary | ICD-10-CM | POA: Diagnosis not present

## 2023-09-17 DIAGNOSIS — E669 Obesity, unspecified: Secondary | ICD-10-CM | POA: Diagnosis not present

## 2023-10-15 ENCOUNTER — Telehealth: Payer: Self-pay

## 2023-10-15 ENCOUNTER — Encounter

## 2023-10-15 NOTE — Telephone Encounter (Signed)
 Contacted patient on preferred number listed in notes for scheduled AWV. Patient declined to complete visit.

## 2023-10-15 NOTE — Progress Notes (Signed)
 Subjective:   MASIYAH JORSTAD is a 78 y.o. who presents for a Medicare Wellness preventive visit.  Visit Complete:     Persons Participating in Visit:   AWV Questionnaire:         Objective:    There were no vitals filed for this visit. There is no height or weight on file to calculate BMI.     10/09/2022    8:27 AM 03/19/2017   10:50 AM 01/24/2017   11:16 AM 07/17/2016    2:32 PM 05/20/2016   11:00 PM 05/20/2016   10:14 AM 05/03/2016   10:27 AM  Advanced Directives  Does Patient Have a Medical Advance Directive? No No No No No No No  Would patient like information on creating a medical advance directive? No - Patient declined    No - Patient declined      Current Medications (verified) Outpatient Encounter Medications as of 10/15/2023  Medication Sig   acetaminophen  (TYLENOL ) 650 MG CR tablet Take 1,300 mg by mouth every 8 (eight) hours as needed for pain.   atorvastatin  (LIPITOR) 20 MG tablet Take 1 tablet (20 mg total) by mouth daily.   Biotin 5 MG TABS Take 1 tablet by mouth at bedtime.   calcium  acetate (PHOSLO) 667 MG capsule Take 667 mg by mouth at bedtime.   clobetasol  (TEMOVATE ) 0.05 % external solution Apply 1 Application topically 2 (two) times daily.   Doxylamine Succinate, Sleep, (SLEEP AID PO) Take 1 tablet by mouth at bedtime.   InFLIXimab  (REMICADE  IV) Inject into the vein See admin instructions. Every 8 weeks at Dr. Rosa [employer] office   metoprolol  succinate (TOPROL -XL) 25 MG 24 hr tablet Take 1 tablet (25 mg total) by mouth daily.   OVER THE COUNTER MEDICATION OTC anti-diarrheal-2 tablets every 3 days   pantoprazole  (PROTONIX ) 20 MG tablet Take 1 tablet (20 mg total) by mouth daily.   Polyethyl Glycol-Propyl Glycol (SYSTANE OP) Place 1 drop into both eyes daily as needed (dry eyes).   traMADol  (ULTRAM ) 50 MG tablet Take 1 tablet (50 mg total) by mouth at bedtime as needed.   XARELTO  20 MG TABS tablet TAKE 1 TABLET BY MOUTH DAILY WITH SUPPER   No  facility-administered encounter medications on file as of 10/15/2023.    Allergies (verified) Methotrexate derivatives   History: Past Medical History:  Diagnosis Date   Allergy    Cataract    surgery to remove   Diverticulosis    Gallstones    GERD (gastroesophageal reflux disease)    Glaucoma    no med - just watching   Hepatitis    as a child   Hiatal hernia    Osteoarthritis    Psoriatic arthritis (HCC)    RA (rheumatoid arthritis) (HCC)    Past Surgical History:  Procedure Laterality Date   BUNIONECTOMY Right    with correction   CESAREAN SECTION  10/21/79, 03/28/81   x 2   CHOLECYSTECTOMY N/A 04/12/2015   Procedure: LAPAROSCOPIC CHOLECYSTECTOMY;  Surgeon: Shela Derby, MD;  Location: [facility] OR;  Service: General;  Laterality: N/A;   COLONOSCOPY  4/20006   Adan Holms   DILATION AND CURETTAGE OF UTERUS     EYE SURGERY Bilateral    cataract surgery w/ lens implant   INTRAOCULAR LENS INSERTION Bilateral    TIBIA FRACTURE SURGERY Right 1986   Bone transplant   WISDOM TOOTH EXTRACTION     Family History  Problem Relation Age of Onset   Diabetes Mother  Heart attack Mother    Transient ischemic attack Mother    Diabetes Brother    Diabetes Maternal Grandmother    Colon cancer Neg Hx    Esophageal cancer Neg Hx    Stomach cancer Neg Hx    Social History   Socioeconomic History   Marital status: Married    Spouse name: Not on file   Number of children: 2   Years of education: Not on file   Highest education level: Not on file  Occupational History   Occupation: retired   Tobacco Use   Smoking status: Never   Smokeless tobacco: Never  Substance and Sexual Activity   Alcohol use: No    Alcohol/week: 0.0 standard drinks of alcohol   Drug use: No   Sexual activity: Not on file  Other Topics Concern   Not on file  Social History Narrative   Not on file   Social Drivers of Health   Financial Resource Strain: Low Risk  (10/09/2022)   Overall Financial  Resource Strain (CARDIA)    Difficulty of Paying Living Expenses: Not hard at all  Food Insecurity: No Food Insecurity (10/09/2022)   Hunger Vital Sign    Worried About Running Out of Food in the Last Year: Never true    Ran Out of Food in the Last Year: Never true  Transportation Needs: No Transportation Needs (10/09/2022)   PRAPARE - Administrator, Civil Service (Medical): No    Lack of Transportation (Non-Medical): No  Physical Activity: Inactive (10/09/2022)   Exercise Vital Sign    Days of Exercise per Week: 0 days    Minutes of Exercise per Session: 0 min  Stress: No Stress Concern Present (10/09/2022)   [employer] of Occupational Health - Occupational Stress Questionnaire    Feeling of Stress : Not at all  Social Connections: Moderately Integrated (10/09/2022)   Social Connection and Isolation Panel [NHANES]    Frequency of Communication with Friends and Family: More than three times a week    Frequency of Social Gatherings with Friends and Family: More than three times a week    Attends Religious Services: More than 4 times per year    Active Member of [employer] or Organizations: Yes    Attends Banker Meetings: More than 4 times per year    Marital Status: Widowed    Tobacco Counseling Counseling given: Not Answered    Clinical Intake:              No results found for: "HGBA1C"             Activities of Daily Living      No data to display          Patient Care Team: Aida House, MD as PCP - General (Family Medicine) Croitoru, Karyl Paget, MD as PCP - Cardiology (Cardiology)  Indicate any recent Medical Services you may have received from other than [facility] providers in the past year (date may be approximate).     Assessment:   This is a routine wellness examination for [facility].  Hearing/Vision screen No results found.   Goals Addressed   None    Depression Screen     10/09/2022    8:23 AM  PHQ 2/9 Scores   PHQ - 2 Score 0    Fall Risk     12/05/2022    9:30 AM 10/09/2022    8:26 AM 01/22/2018    3:47 PM  Fall Risk  Falls in the past year? 0 0 No  Comment   Emmi Telephone Survey: data to providers prior to load  Number falls in past yr: 0 0   Injury with Fall? 0 0   Risk for fall due to : No Fall Risks No Fall Risks   Follow up Falls evaluation completed Falls prevention discussed     MEDICARE RISK AT HOME:     TIMED UP AND GO:  Was the test performed?    Cognitive Function:         10/09/2022    8:27 AM  6CIT Screen  What Year? 0 points  What month? 0 points  What time? 0 points  Count back from 20 0 points  Months in reverse 0 points  Repeat phrase 0 points  Total Score 0 points    Immunizations Immunization History  Administered Date(s) Administered   Fluad Trivalent(High Dose 65+) 06/04/2023   PNEUMOCOCCAL CONJUGATE-20 06/04/2023    Screening Tests Health Maintenance  Topic Date Due   COVID-19 Vaccine (1) Never done   Hepatitis C Screening  Never done   DTaP/Tdap/Td (1 - Tdap) Never done   Zoster Vaccines- Shingrix (1 of 2) Never done   Medicare Annual Wellness (AWV)  10/09/2023   INFLUENZA VACCINE  01/23/2024   Pneumonia Vaccine 65+ Years old  Completed   DEXA SCAN  Completed   HPV VACCINES  Aged Out   Meningococcal B Vaccine  Aged Out   Colonoscopy  Discontinued    Health Maintenance  Health Maintenance Due  Topic Date Due   COVID-19 Vaccine (1) Never done   Hepatitis C Screening  Never done   DTaP/Tdap/Td (1 - Tdap) Never done   Zoster Vaccines- Shingrix (1 of 2) Never done   Medicare Annual Wellness (AWV)  10/09/2023   Health Maintenance Items Addressed:   Additional Screening:  Vision Screening: Recommended annual ophthalmology exams for early detection of glaucoma and other disorders of the eye.  Dental Screening: Recommended annual dental exams for proper oral hygiene  Community Resource Referral / Chronic Care Management: CRR  required this visit?    CCM required this visit?       Plan:     I have personally reviewed and noted the following in the patient's chart:   Medical and social history Use of alcohol, tobacco or illicit drugs  Current medications and supplements including opioid prescriptions.  Functional ability and status Nutritional status Physical activity Advanced directives List of other physicians Hospitalizations, surgeries, and ER visits in previous 12 months Vitals Screenings to include cognitive, depression, and falls Referrals and appointments  In addition, I have reviewed and discussed with patient certain preventive protocols, quality metrics, and best practice recommendations. A written personalized care plan for preventive services as well as general preventive health recommendations were provided to patient.     Dewayne Ford, LPN   [id number]/82/9562   After Visit Summary:   Notes: This encounter was created in error - please disregard. Patient declined to complete visit

## 2023-10-28 ENCOUNTER — Other Ambulatory Visit: Payer: Self-pay | Admitting: Family Medicine

## 2023-10-28 DIAGNOSIS — M069 Rheumatoid arthritis, unspecified: Secondary | ICD-10-CM

## 2023-10-29 DIAGNOSIS — L4050 Arthropathic psoriasis, unspecified: Secondary | ICD-10-CM | POA: Diagnosis not present

## 2023-12-04 ENCOUNTER — Other Ambulatory Visit: Payer: Self-pay | Admitting: Family Medicine

## 2023-12-04 DIAGNOSIS — I2510 Atherosclerotic heart disease of native coronary artery without angina pectoris: Secondary | ICD-10-CM

## 2023-12-04 DIAGNOSIS — M069 Rheumatoid arthritis, unspecified: Secondary | ICD-10-CM

## 2023-12-10 ENCOUNTER — Ambulatory Visit: Payer: Medicare PPO | Admitting: Family Medicine

## 2023-12-11 ENCOUNTER — Encounter: Payer: Self-pay | Admitting: Family Medicine

## 2023-12-18 ENCOUNTER — Other Ambulatory Visit: Payer: Self-pay | Admitting: Adult Health

## 2023-12-18 DIAGNOSIS — M069 Rheumatoid arthritis, unspecified: Secondary | ICD-10-CM

## 2023-12-24 DIAGNOSIS — L4050 Arthropathic psoriasis, unspecified: Secondary | ICD-10-CM | POA: Diagnosis not present

## 2023-12-24 DIAGNOSIS — Z79899 Other long term (current) drug therapy: Secondary | ICD-10-CM | POA: Diagnosis not present

## 2024-01-02 ENCOUNTER — Encounter: Payer: Self-pay | Admitting: Family Medicine

## 2024-01-02 ENCOUNTER — Ambulatory Visit (INDEPENDENT_AMBULATORY_CARE_PROVIDER_SITE_OTHER): Admitting: Family Medicine

## 2024-01-02 VITALS — BP 136/82 | HR 57 | Temp 98.0°F | Ht 62.75 in | Wt 190.2 lb

## 2024-01-02 DIAGNOSIS — Z13220 Encounter for screening for lipoid disorders: Secondary | ICD-10-CM | POA: Diagnosis not present

## 2024-01-02 DIAGNOSIS — Z0000 Encounter for general adult medical examination without abnormal findings: Secondary | ICD-10-CM

## 2024-01-02 DIAGNOSIS — Z131 Encounter for screening for diabetes mellitus: Secondary | ICD-10-CM | POA: Diagnosis not present

## 2024-01-02 DIAGNOSIS — Z1231 Encounter for screening mammogram for malignant neoplasm of breast: Secondary | ICD-10-CM | POA: Diagnosis not present

## 2024-01-02 LAB — CBC WITH DIFFERENTIAL/PLATELET
Basophils Absolute: 0 K/uL (ref 0.0–0.1)
Basophils Relative: 0.9 % (ref 0.0–3.0)
Eosinophils Absolute: 0.2 K/uL (ref 0.0–0.7)
Eosinophils Relative: 3.5 % (ref 0.0–5.0)
HCT: 40.7 % (ref 36.0–46.0)
Hemoglobin: 13.4 g/dL (ref 12.0–15.0)
Lymphocytes Relative: 47.2 % — ABNORMAL HIGH (ref 12.0–46.0)
Lymphs Abs: 2.4 K/uL (ref 0.7–4.0)
MCHC: 33 g/dL (ref 30.0–36.0)
MCV: 93.1 fl (ref 78.0–100.0)
Monocytes Absolute: 0.4 K/uL (ref 0.1–1.0)
Monocytes Relative: 8.9 % (ref 3.0–12.0)
Neutro Abs: 2 K/uL (ref 1.4–7.7)
Neutrophils Relative %: 39.5 % — ABNORMAL LOW (ref 43.0–77.0)
Platelets: 248 K/uL (ref 150.0–400.0)
RBC: 4.37 Mil/uL (ref 3.87–5.11)
RDW: 13.7 % (ref 11.5–15.5)
WBC: 5 K/uL (ref 4.0–10.5)

## 2024-01-02 LAB — COMPREHENSIVE METABOLIC PANEL WITH GFR
ALT: 10 U/L (ref 0–35)
AST: 19 U/L (ref 0–37)
Albumin: 4.2 g/dL (ref 3.5–5.2)
Alkaline Phosphatase: 65 U/L (ref 39–117)
BUN: 16 mg/dL (ref 6–23)
CO2: 34 meq/L — ABNORMAL HIGH (ref 19–32)
Calcium: 9.3 mg/dL (ref 8.4–10.5)
Chloride: 102 meq/L (ref 96–112)
Creatinine, Ser: 0.78 mg/dL (ref 0.40–1.20)
GFR: 72.3 mL/min (ref >60.00)
Glucose, Bld: 98 mg/dL (ref 70–99)
Potassium: 4.1 meq/L (ref 3.5–5.1)
Sodium: 140 meq/L (ref 135–145)
Total Bilirubin: 0.7 mg/dL (ref 0.2–1.2)
Total Protein: 7.7 g/dL (ref 6.0–8.3)

## 2024-01-02 LAB — LIPID PANEL
Cholesterol: 130 mg/dL (ref 0–200)
HDL: 53.1 mg/dL (ref >39.00)
LDL Cholesterol: 53 mg/dL (ref 0–99)
NonHDL: 76.88
Total CHOL/HDL Ratio: 2
Triglycerides: 118 mg/dL (ref 0.0–149.0)
VLDL: 23.6 mg/dL (ref 0.0–40.0)

## 2024-01-02 LAB — HEMOGLOBIN A1C: Hgb A1c MFr Bld: 6.3 % (ref 4.6–6.5)

## 2024-01-02 NOTE — Progress Notes (Signed)
 Complete physical exam  Patient: Phyllis Austin   DOB: [birth year]   78 y.o. Female  MRN: [id number]  Subjective:    Chief Complaint  Patient presents with   Annual Exam    Phyllis Austin is a 78 y.o. female who presents today for a complete physical exam. She reports consuming a general diet, eats fruits and veggies, good sources of protein, eats dairy, drinks milk. Home exercise routine includes walking 1-2  hrs per week. She generally feels well. She reports sleeping fairly well. She does not have additional problems to discuss today.    Most recent fall risk assessment:    01/02/2024   10:40 AM  Fall Risk   Falls in the past year? 0  Number falls in past yr: 0  Injury with Fall? 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   Obesity (BMI 30-39.9) 08/22/2022   Chronic diarrhea 08/22/2022   Polyuria 08/22/2022   Popliteal cyst, left 08/22/2022   Rheumatoid arthritis involving multiple sites (HCC) 07/02/2018   Peripheral venous insufficiency 07/02/2018   Long term current use of anticoagulant 07/02/2018   History of DVT (deep vein thrombosis) 07/02/2018   Left leg pain 07/23/2016   Right ureteral stone 05/20/2016   Abdominal pain, epigastric 03/23/2015   Cholelithiasis 03/23/2015   Hiatal hernia 03/23/2015      Patient Care Team: Ozell Heron HERO, MD as PCP - General (Family Medicine) Croitoru, Jerel, MD as PCP - Cardiology (Cardiology)   Outpatient Medications Prior to Visit  Medication Sig    acetaminophen  (TYLENOL ) 650 MG CR tablet Take 1,300 mg by mouth every 8 (eight) hours as needed for pain.   atorvastatin  (LIPITOR) 20 MG tablet TAKE 1 TABLET BY MOUTH EVERY DAY   Biotin 5 MG TABS Take 1 tablet by mouth at bedtime.   calcium  acetate (PHOSLO) 667 MG capsule Take 667 mg by mouth at bedtime.   clobetasol  (TEMOVATE ) 0.05 % external solution Apply 1 Application topically 2 (two) times daily.   Doxylamine Succinate, Sleep, (SLEEP AID PO) Take 1 tablet by mouth at bedtime.   InFLIXimab  (REMICADE  IV) Inject into the vein See admin instructions. Every 8 weeks at Dr. Willadean office   metoprolol  succinate (TOPROL -XL) 25 MG 24 hr tablet Take 1 tablet (25 mg total) by mouth daily.   OVER THE COUNTER MEDICATION OTC anti-diarrheal-2 tablets every 3 days   pantoprazole  (PROTONIX ) 20 MG tablet Take 1 tablet (20 mg total) by mouth daily.   Polyethyl Glycol-Propyl Glycol (SYSTANE OP) Place 1 drop into both eyes daily as needed (dry eyes).   traMADol  (ULTRAM ) 50 MG tablet Take 1 tablet (50 mg total) by mouth every 12 (twelve) hours as needed.   XARELTO  20 MG TABS tablet TAKE 1 TABLET BY MOUTH DAILY WITH SUPPER   No facility-administered medications prior to visit.    Review of Systems  HENT:  Negative for hearing loss.   Eyes:  Negative for blurred vision.  Respiratory:  Negative for shortness of breath.   Cardiovascular:  Negative for chest pain.  Gastrointestinal: Negative.   Genitourinary: Negative.   Musculoskeletal:  Negative for back pain.  Neurological:  Negative for headaches.  Psychiatric/Behavioral:  Negative for depression.        Objective:     BP 136/82   Pulse (!) 57   Temp 98 F (36.7 C) (Oral)   Ht 5' 2.75 (1.594 m)   Wt 190 lb 3.2 oz (86.3 kg)   SpO2 95%   BMI 33.96 kg/m    Physical Exam Vitals reviewed.  Constitutional:      Appearance: Normal appearance. She is well-groomed. She is obese.  HENT:     Right Ear: Tympanic membrane and ear canal normal.      Left Ear: Tympanic membrane and ear canal normal.     Mouth/Throat:     Mouth: Mucous membranes are moist.     Pharynx: No posterior oropharyngeal erythema.  Eyes:     Conjunctiva/sclera: Conjunctivae normal.  Neck:     Thyroid : No thyromegaly.  Cardiovascular:     Rate and Rhythm: Normal rate and regular rhythm.     Pulses: Normal pulses.     Heart sounds: S1 normal and S2 normal.     Comments: Diffuse large varicose veins in the legs BL Pulmonary:     Effort: Pulmonary effort is normal.     Breath sounds: Normal breath sounds and air entry.  Abdominal:     General: Abdomen is flat. Bowel sounds are normal.     Palpations: Abdomen is soft.  Musculoskeletal:     Right lower leg: No edema.     Left lower leg: No edema.  Neurological:     Mental Status: She is alert and oriented to person, place, and time. Mental status is at baseline.     Gait: Gait is intact.  Psychiatric:        Mood and Affect: Mood and affect normal.        Speech: Speech normal.        Behavior: Behavior normal.        Judgment: Judgment normal.      No results found for any visits on 01/02/24.     Assessment & Plan:    Routine Health Maintenance and Physical Exam  Immunization History  Administered Date(s) Administered   Fluad Trivalent(High Dose 65+) 06/04/2023   PNEUMOCOCCAL CONJUGATE-20 06/04/2023    Health Maintenance  Topic Date Due   COVID-19 Vaccine (1) Never done   DTaP/Tdap/Td (1 - Tdap) Never done   Zoster Vaccines- Shingrix (1 of 2) Never done   Medicare Annual Wellness (AWV)  10/09/2023   Hepatitis C Screening  01/01/2025 (Originally 09/12/1963)   INFLUENZA VACCINE  01/23/2024   Pneumococcal Vaccine: 50+ Years  Completed   DEXA SCAN  Completed   Hepatitis B Vaccines  Aged Out   HPV VACCINES  Aged Out   Meningococcal B Vaccine  Aged Out   Colonoscopy  Discontinued    Discussed health benefits of physical activity, and encouraged her to engage in regular exercise  appropriate for her age and condition.  Diabetes mellitus screening -     Hemoglobin A1c; Future  Lipid screening -     Lipid panel; Future  Encounter for screening mammogram for malignant neoplasm of breast -     3D Screening Mammogram, Left and Right; Future  Routine general medical examination at a health  care facility -     Comprehensive metabolic panel with GFR; Future -     CBC with Differential/Platelet; Future  Normal/stable physical exam findings. I counseled the patient on the recommended amount of exercise per CDC recommendation. I reviewed preventative screening, immunizations, and medical history and updated in the chart, and appropriate labs and vaccinations were ordered. Handouts given on healthy eating and exercise.    Return in 1 year (on 01/01/2025) for annual physical exam.     Heron CHRISTELLA Sharper, MD

## 2024-01-02 NOTE — Patient Instructions (Signed)

## 2024-01-09 ENCOUNTER — Ambulatory Visit: Payer: Self-pay | Admitting: Family Medicine

## 2024-01-14 ENCOUNTER — Ambulatory Visit: Attending: Cardiovascular Disease | Admitting: Cardiovascular Disease

## 2024-01-14 ENCOUNTER — Encounter: Payer: Self-pay | Admitting: Cardiovascular Disease

## 2024-01-14 VITALS — BP 118/70 | HR 73 | Ht 62.0 in | Wt 188.8 lb

## 2024-01-14 DIAGNOSIS — I2510 Atherosclerotic heart disease of native coronary artery without angina pectoris: Secondary | ICD-10-CM

## 2024-01-14 DIAGNOSIS — R0602 Shortness of breath: Secondary | ICD-10-CM | POA: Diagnosis not present

## 2024-01-14 DIAGNOSIS — Z86718 Personal history of other venous thrombosis and embolism: Secondary | ICD-10-CM | POA: Diagnosis not present

## 2024-01-14 DIAGNOSIS — I700 Atherosclerosis of aorta: Secondary | ICD-10-CM

## 2024-01-14 MED ORDER — RIVAROXABAN 20 MG PO TABS: 20.0000 mg | ORAL_TABLET | Freq: Every day | ORAL | 3 refills | Status: AC

## 2024-01-14 NOTE — Patient Instructions (Signed)
 Medication Instructions:  No changes *If you need a refill on your cardiac medications before your next appointment, please call your pharmacy*  Lab Work: No changes If you have labs (blood work) drawn today and your tests are completely normal, you will receive your results only by: MyChart Message (if you have MyChart) OR A paper copy in the mail If you have any lab test that is abnormal or we need to change your treatment, we will call you to review the results.  Testing/Procedures: No changes  Follow-Up: At [facility], you and your health needs are our priority.  As part of our continuing mission to provide you with exceptional heart care, our providers are all part of one team.  This team includes your primary Cardiologist (physician) and Advanced Practice Providers or APPs (Physician Assistants and Nurse Practitioners) who all work together to provide you with the care you need, when you need it.  Your next appointment:   1 year(s)  Provider:   Jerel Balding, MD    We recommend signing up for the patient portal called MyChart.  Sign up information is provided on this After Visit Summary.  MyChart is used to connect with patients for Virtual Visits (Telemedicine).  Patients are able to view lab/test results, encounter notes, upcoming appointments, etc.  Non-urgent messages can be sent to your provider as well.   To learn more about what you can do with MyChart, go to [email or web address].

## 2024-01-14 NOTE — Progress Notes (Signed)
 Cardiology Office Note:    Date:  01/14/2024   ID:  Phyllis Austin, Phyllis Austin Nov 09, 1945, MRN [id number]  PCP:  Ozell Heron HERO, MD  Cardiologist:  Jerel Balding, MD   Referring MD: Ozell Heron HERO, MD   Chief Complaint  Patient presents with   Leg Swelling    History of Present Illness:    Phyllis Austin is a 78 y.o. 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  She has been compliant with anticoagulation with Xarelto  since then.  She also has rheumatoid arthritis and plaque psoriasis and is on chronic treatment with Remicade .  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  Has not had any bleeding problems on anticoagulation with Xarelto .  Denies falls.  Has mild exertional dyspnea if she has to walk very long distances especially in the heat.  This has not changed much.  She does not have orthopnea or PND.  She has not had wheezing or need to use bronchodilators in quite a while.  Denies any chest pain.  In 2022, had a CT angiogram of the chest that did not show any evidence of pulmonary embolism, although did show significant atherosclerosis in the aortic arch and a chronically elevated right hemidiaphragm.  There was evidence of thoracic spine degenerative changes, but there was no compression fracture.  Normal.  She also underwent an echocardiogram that showed normal regional wall motion and normal LVEF on Oct 23, 2020.  The BNP was very low at only 17.  Interestingly, her sed rate was elevated at 74.  She is on chronic Remicade  therapy for the last 10 years or so, followed by Dr. Mai.  She has a family history of premature onset CAD: Her mother had a myocardial infarction at age 7 and died of a second infarction at age 51, her maternal grandmother died of a myocardial infarction at the age of 11.  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    Past Medical History:  Diagnosis Date   Allergy    Cataract    surgery to remove   Diverticulosis    Gallstones    GERD (gastroesophageal reflux disease)    Glaucoma    no med - just watching   Hepatitis    as a child   Hiatal hernia    Osteoarthritis    Psoriatic arthritis (HCC)    RA (rheumatoid arthritis) (HCC)     Past Surgical History:  Procedure Laterality Date   BUNIONECTOMY Right    with correction   CESAREAN SECTION  10/21/79, 03/28/81   x 2   CHOLECYSTECTOMY N/A 04/12/2015   Procedure: LAPAROSCOPIC CHOLECYSTECTOMY;  Surgeon: Lynda Leos, MD;  Location: [facility] OR;  Service: General;  Laterality: N/A;   COLONOSCOPY  4/20006   [facility]   DILATION AND CURETTAGE OF UTERUS     EYE SURGERY Bilateral    cataract surgery w/ lens implant   INTRAOCULAR LENS INSERTION Bilateral    TIBIA FRACTURE SURGERY Right 1986   Bone transplant   WISDOM TOOTH EXTRACTION      Current Medications: Current Meds  Medication Sig   acetaminophen  (TYLENOL ) 650 MG CR tablet Take 1,300 mg by mouth every 8 (eight) hours as needed for pain.   atorvastatin  (LIPITOR) 20 MG tablet TAKE 1 TABLET BY MOUTH EVERY DAY   Biotin 5 MG TABS  Take 1 tablet by mouth at bedtime.   calcium  acetate (PHOSLO) 667 MG capsule Take 667 mg by mouth at bedtime.   clobetasol  (TEMOVATE ) 0.05 % external solution Apply 1 Application topically 2 (two) times daily.   Doxylamine Succinate, Sleep, (SLEEP AID PO) Take 1 tablet by mouth at bedtime.   InFLIXimab  (REMICADE  IV) Inject into the vein See admin instructions. Every 8 weeks at Dr. Willadean office   metoprolol  succinate (TOPROL -XL) 25 MG 24 hr tablet Take 1 tablet (25 mg total) by mouth daily.   OVER THE COUNTER MEDICATION OTC anti-diarrheal-2 tablets every 3 days (Patient taking differently: Take 2 tablets by mouth every other day.)   pantoprazole  (PROTONIX ) 20 MG tablet Take 1 tablet (20 mg total) by mouth daily.    traMADol  (ULTRAM ) 50 MG tablet Take 1 tablet (50 mg total) by mouth every 12 (twelve) hours as needed.   [DISCONTINUED] XARELTO  20 MG TABS tablet TAKE 1 TABLET BY MOUTH DAILY WITH SUPPER     Allergies:   Methotrexate derivatives  Family History: The patient's family history includes Diabetes in her brother, maternal grandmother, and mother; Heart attack in her mother; Transient ischemic attack in her mother. There is no history of Colon cancer, Esophageal cancer, or Stomach cancer.  ROS:   Please see the history of present illness.    All other systems are reviewed and are negative.   EKGs/Labs/Other Studies Reviewed:    The following studies were reviewed today: Venous duplex ultrasound  08/09/2022 Summary:  RIGHT:  - No evidence of common femoral vein obstruction.    LEFT:  - There is recanalized thrombus in the left femoral and popliteal vein(s).  - Findings consistent with chronic deep vein thrombosis involving the left  femoral vein, and left popliteal vein.  - No cystic structure found in the popliteal fossa.  - Moderate fluid in left posterior popliteal joint space, 3.54 cm length x  0.88 cm width.     EKG:    EKG Interpretation Date/Time:  Wednesday January 14 2024 10:37:43 EDT Ventricular Rate:  73 PR Interval:  184 QRS Duration:  84 QT Interval:  388 QTC Calculation: 427 R Axis:   -10  Text Interpretation: Normal sinus rhythm Low voltage QRS When compared with ECG of 10-Dec-2017 17:32, No significant change was found Confirmed by Takenya Travaglini ([id number]) on 01/14/2024 10:59:53 AM         Recent Labs: 01/02/2024: ALT 10; BUN 16; Creatinine, Ser 0.78; Hemoglobin 13.4; Platelets 248.0; Potassium 4.1; Sodium 140  Recent Lipid Panel    Component Value Date/Time   CHOL 130 01/02/2024 1142   TRIG 118.0 01/02/2024 1142   HDL 53.10 01/02/2024 1142   CHOLHDL 2 01/02/2024 1142   VLDL 23.6 01/02/2024 1142   LDLCALC 53 01/02/2024 1142    Physical Exam:    VS:  BP  118/70 (BP Location: Left Arm, Patient Position: Sitting, Cuff Size: Large)   Pulse 73   Ht 5' 2 (1.575 m)   Wt 188 lb 12.8 oz (85.6 kg)   SpO2 98%   BMI 34.53 kg/m     Wt Readings from Last 3 Encounters:  01/14/24 188 lb 12.8 oz (85.6 kg)  01/02/24 190 lb 3.2 oz (86.3 kg)  06/04/23 189 lb 4.8 oz (85.9 kg)      General: Alert, oriented x3, no distress, moderately obese. Head: no evidence of trauma, PERRL, EOMI, no exophtalmos or lid lag, no myxedema, no xanthelasma; normal ears, nose and oropharynx Neck: normal jugular  venous pulsations and no hepatojugular reflux; brisk carotid pulses without delay and no carotid bruits Chest: clear to auscultation, no signs of consolidation by percussion or palpation, normal fremitus, symmetrical and full respiratory excursions Cardiovascular: normal position and quality of the apical impulse, regular rhythm, normal first and second heart sounds, no murmurs, rubs or gallops Abdomen: no tenderness or distention, no masses by palpation, no abnormal pulsatility or arterial bruits, normal bowel sounds, no hepatosplenomegaly Extremities: no clubbing, cyanosis or edema; 2+ radial, ulnar and brachial pulses bilaterally; 2+ right femoral, posterior tibial and dorsalis pedis pulses; 2+ left femoral, posterior tibial and dorsalis pedis pulses; no subclavian or femoral bruits Neurological: grossly nonfocal Psych: Normal mood and affect      ASSESSMENT:    1. Shortness of breath   2. Coronary artery calcification seen on CT scan   3. Atherosclerosis of aorta (HCC)   4. History of DVT (deep vein thrombosis)     PLAN:    In order of problems listed above:  Dyspnea: Relatively mild, unchanged, NYHA functional class II.  Probably multifactorial, with a history of reactive airway disease and moderate obesity playing a role.  Cannot entirely exclude contribution of heart disease.  Coronary artery calcifications seen on previous CT of the chest, but normal  Lexiscan  Myoview  without ischemia or infarction in June 2022.  EF was borderline at 53% on the nuclear scintigraphy study but was normal on the echocardiogram.  Diastolic parameters were only mildly abnormal, especially when taking her age into account and the E/e' ratio did not support elevated left heart filling pressures. Aortic atherosclerosis: Excellent recent lipid profile, continue statin.  At target LDL less than 70. History of DVT/peripheral venous insufficiency: Has mild symmetrical ankle edema related to peripheral venous insufficiency/varicose veins.  No venous stasis ulcers.  Continue to recommend compression stockings and leg elevation. Xarelto : Denies bleeding complications. RA: Symptoms controlled on infliximab .  Avoid NSAIDs due to the anticoagulation.    Medication Adjustments/Labs and Tests Ordered: Current medicines are reviewed at length with the patient today.  Concerns regarding medicines are outlined above.  Orders Placed This Encounter  Procedures   EKG 12-Lead   Meds ordered this encounter  Medications   rivaroxaban  (XARELTO ) 20 MG TABS tablet    Sig: Take 1 tablet (20 mg total) by mouth daily with supper.    Dispense:  90 tablet    Refill:  3    Patient Instructions  Medication Instructions:  No changes *If you need a refill on your cardiac medications before your next appointment, please call your pharmacy*  Lab Work: No changes If you have labs (blood work) drawn today and your tests are completely normal, you will receive your results only by: MyChart Message (if you have MyChart) OR A paper copy in the mail If you have any lab test that is abnormal or we need to change your treatment, we will call you to review the results.  Testing/Procedures: No changes  Follow-Up: At [facility], you and your health needs are our priority.  As part of our continuing mission to provide you with exceptional heart care, our providers are all part of one  team.  This team includes your primary Cardiologist (physician) and Advanced Practice Providers or APPs (Physician Assistants and Nurse Practitioners) who all work together to provide you with the care you need, when you need it.  Your next appointment:   1 year(s)  Provider:   Jerel Balding, MD    We recommend  signing up for the patient portal called MyChart.  Sign up information is provided on this After Visit Summary.  MyChart is used to connect with patients for Virtual Visits (Telemedicine).  Patients are able to view lab/test results, encounter notes, upcoming appointments, etc.  Non-urgent messages can be sent to your provider as well.   To learn more about what you can do with MyChart, go to [email or web address].          Signed, Jerel Balding, MD  01/14/2024 11:03 AM    [facility]

## 2024-01-21 ENCOUNTER — Ambulatory Visit
Admission: RE | Admit: 2024-01-21 | Discharge: 2024-01-21 | Disposition: A | Discharge status: Home / Self Care | Source: Ambulatory Visit | Attending: Family Medicine | Admitting: Family Medicine

## 2024-01-21 DIAGNOSIS — Z1231 Encounter for screening mammogram for malignant neoplasm of breast: Secondary | ICD-10-CM | POA: Diagnosis not present

## 2024-02-18 DIAGNOSIS — L4050 Arthropathic psoriasis, unspecified: Secondary | ICD-10-CM | POA: Diagnosis not present

## 2024-02-19 ENCOUNTER — Other Ambulatory Visit: Payer: Self-pay | Admitting: Family Medicine

## 2024-02-19 DIAGNOSIS — M069 Rheumatoid arthritis, unspecified: Secondary | ICD-10-CM

## 2024-03-25 ENCOUNTER — Ambulatory Visit: Admitting: Family Medicine

## 2024-03-26 ENCOUNTER — Ambulatory Visit: Admitting: Family Medicine

## 2024-03-28 ENCOUNTER — Other Ambulatory Visit: Payer: Self-pay | Admitting: Family Medicine

## 2024-03-28 DIAGNOSIS — I2510 Atherosclerotic heart disease of native coronary artery without angina pectoris: Secondary | ICD-10-CM

## 2024-04-03 ENCOUNTER — Other Ambulatory Visit: Payer: Self-pay | Admitting: Family Medicine

## 2024-04-03 DIAGNOSIS — M069 Rheumatoid arthritis, unspecified: Secondary | ICD-10-CM

## 2024-04-14 DIAGNOSIS — L4050 Arthropathic psoriasis, unspecified: Secondary | ICD-10-CM | POA: Diagnosis not present

## 2024-04-20 DIAGNOSIS — M1991 Primary osteoarthritis, unspecified site: Secondary | ICD-10-CM | POA: Diagnosis not present

## 2024-04-20 DIAGNOSIS — L409 Psoriasis, unspecified: Secondary | ICD-10-CM | POA: Diagnosis not present

## 2024-04-20 DIAGNOSIS — M25511 Pain in right shoulder: Secondary | ICD-10-CM | POA: Diagnosis not present

## 2024-04-20 DIAGNOSIS — L4050 Arthropathic psoriasis, unspecified: Secondary | ICD-10-CM | POA: Diagnosis not present

## 2024-04-20 DIAGNOSIS — E669 Obesity, unspecified: Secondary | ICD-10-CM | POA: Diagnosis not present

## 2024-04-20 DIAGNOSIS — Z79899 Other long term (current) drug therapy: Secondary | ICD-10-CM | POA: Diagnosis not present

## 2024-04-20 DIAGNOSIS — K219 Gastro-esophageal reflux disease without esophagitis: Secondary | ICD-10-CM | POA: Diagnosis not present

## 2024-04-20 DIAGNOSIS — Z6833 Body mass index (BMI) 33.0-33.9, adult: Secondary | ICD-10-CM | POA: Diagnosis not present

## 2024-05-22 ENCOUNTER — Other Ambulatory Visit: Payer: Self-pay | Admitting: Family Medicine

## 2024-05-22 DIAGNOSIS — I2510 Atherosclerotic heart disease of native coronary artery without angina pectoris: Secondary | ICD-10-CM

## 2024-06-28 ENCOUNTER — Ambulatory Visit: Payer: Self-pay

## 2024-06-28 NOTE — Telephone Encounter (Signed)
 Appt today at 3pm with Dr Micheal.

## 2024-06-28 NOTE — Telephone Encounter (Signed)
 Noted- ok to close.

## 2024-06-28 NOTE — Telephone Encounter (Signed)
 FYI Only or Action Required?: FYI only for provider: appointment scheduled on 06/29/24.  Patient was last seen in primary care on 01/02/2024 by Ozell Heron HERO, MD.  Called Nurse Triage reporting Cough.  Symptoms began 2 weeks ago.  Interventions attempted: Rest, hydration, or home remedies.  Symptoms are: stable.  Triage Disposition: See PCP When Office is Open (Within 3 Days)  Patient/caregiver understands and will follow disposition?: Yes  Copied from CRM #[id number]. Topic: Clinical - Red Word Triage >> Jun 28, 2024  9:57 AM Deleta RAMAN wrote: Red Word that prompted transfer to Nurse Triage: green mucus and pain in lungs. Believe she has possible infection trouble when trying to inhale and exhale 2-3 weeks feeling this way Reason for Disposition  [1] Nasal discharge AND [2] present > 10 days  Answer Assessment - Initial Assessment Questions 1. ONSET: When did the cough begin?      2 Weeks ago 2. SEVERITY: How bad is the cough today?      mild 3. SPUTUM: Describe the color of your sputum (e.g., none, dry cough; clear, white, yellow, green)     green 4. HEMOPTYSIS: Are you coughing up any blood? If Yes, ask: How much? (e.g., flecks, streaks, tablespoons, etc.)     denies 5. DIFFICULTY BREATHING: Are you having difficulty breathing? If Yes, ask: How bad is it? (e.g., mild, moderate, severe)      no 6. FEVER: Do you have a fever? If Yes, ask: What is your temperature, how was it measured, and when did it start?     no 7. CARDIAC HISTORY: Do you have any history of heart disease? (e.g., heart attack, congestive heart failure)      no 8. LUNG HISTORY: Do you have any history of lung disease?  (e.g., pulmonary embolus, asthma, emphysema)     no 9. PE RISK FACTORS: Do you have a history of blood clots? (or: recent major surgery, recent prolonged travel, bedridden)      10. OTHER SYMPTOMS: Do you have any other symptoms? (e.g., runny nose, wheezing, chest  pain)       Runny nose  Protocols used: Cough - Acute Productive-A-AH

## 2024-06-29 ENCOUNTER — Ambulatory Visit (INDEPENDENT_AMBULATORY_CARE_PROVIDER_SITE_OTHER): Admitting: Family Medicine

## 2024-06-29 ENCOUNTER — Encounter: Payer: Self-pay | Admitting: Family Medicine

## 2024-06-29 ENCOUNTER — Ambulatory Visit

## 2024-06-29 VITALS — BP 116/66 | HR 90 | Temp 97.9°F | Wt 180.3 lb

## 2024-06-29 DIAGNOSIS — R058 Other specified cough: Secondary | ICD-10-CM

## 2024-06-29 MED ORDER — AMOXICILLIN-POT CLAVULANATE 875-125 MG PO TABS: 1.0000 | ORAL_TABLET | Freq: Two times a day (BID) | ORAL | 0 refills | Status: AC

## 2024-06-29 NOTE — Progress Notes (Signed)
 "  Established Patient Office Visit  Subjective   Patient ID: Phyllis Austin, female    DOB: July 15, 1945  Age: 79 y.o. MRN: [id number]  Chief Complaint  Patient presents with   Cough   Shortness of Breath        Fatigue    HPI   Phyllis Austin is a 79 year old female with history of rheumatoid arthritis, history of DVT who is seen with almost 3-week history of productive cough and some shortness of breath and fatigue.  Not aware of any fever.  She does take Remicade  injections monthly.  Other medications include Xarelto , Protonix , Toprol -XL, atorvastatin .  Never smoked.  No chronic lung disease.  No hemoptysis.  Did have previous CT angiogram chest 4-22 which showed some scattered bilateral areas of subpleural scarring both lung bases. She denies any recent pleuritic pain, chest pain, or hemoptysis.  She states she has lost about 8 or 10 pounds that she has been sick with some reduction in appetite.  Past Medical History:  Diagnosis Date   Allergy    Cataract    surgery to remove   Diverticulosis    Gallstones    GERD (gastroesophageal reflux disease)    Glaucoma    no med - just watching   Hepatitis    as a child   Hiatal hernia    Osteoarthritis    Psoriatic arthritis (HCC)    RA (rheumatoid arthritis) (HCC)    Past Surgical History:  Procedure Laterality Date   BUNIONECTOMY Right    with correction   CESAREAN SECTION  10/21/79, 03/28/81   x 2   CHOLECYSTECTOMY N/A 04/12/2015   Procedure: LAPAROSCOPIC CHOLECYSTECTOMY;  Surgeon: Lynda Leos, MD;  Location: [facility] OR;  Service: General;  Laterality: N/A;   COLONOSCOPY  4/20006   [facility]   DILATION AND CURETTAGE OF UTERUS     EYE SURGERY Bilateral    cataract surgery w/ lens implant   INTRAOCULAR LENS INSERTION Bilateral    TIBIA FRACTURE SURGERY Right 1986   Bone transplant   WISDOM TOOTH EXTRACTION      reports that she has never smoked. She has never used smokeless tobacco. She reports that she does not drink  alcohol and does not use drugs. family history includes Diabetes in her brother, maternal grandmother, and mother; Heart attack in her mother; Transient ischemic attack in her mother. Allergies[1]  Review of Systems  Constitutional:  Positive for malaise/fatigue and weight loss. Negative for chills and fever.  HENT:  Negative for sore throat.   Respiratory:  Positive for cough and sputum production. Negative for hemoptysis and wheezing.   Cardiovascular:  Negative for chest pain.      Objective:     BP 116/66   Pulse 90   Temp 97.9 F (36.6 C) (Oral)   Wt 180 lb 4.8 oz (81.8 kg)   SpO2 95%   BMI 32.98 kg/m  BP Readings from Last 3 Encounters:  06/29/24 116/66  01/14/24 118/70  01/02/24 136/82   Wt Readings from Last 3 Encounters:  06/29/24 180 lb 4.8 oz (81.8 kg)  01/14/24 188 lb 12.8 oz (85.6 kg)  01/02/24 190 lb 3.2 oz (86.3 kg)      Physical Exam Vitals reviewed.  Constitutional:      General: She is not in acute distress.    Appearance: She is not ill-appearing.  Cardiovascular:     Rate and Rhythm: Normal rate.  Pulmonary:     Comments: She has some rales right  base greater than left.  Not sure if these are chronic.  No wheezes. Musculoskeletal:     Cervical back: Neck supple.  Neurological:     Mental Status: She is alert.      No results found for any visits on 06/29/24.    The 10-year ASCVD risk score (Arnett DK, et al., 2019) is: 20.7%    Assessment & Plan:   Problem List Items Addressed This Visit   None Visit Diagnoses       Productive cough    -  Primary   Relevant Orders   DG Chest 2 View     79 year old female with history of rheumatoid arthritis who is seen with 3-week history of productive cough along with nonspecific symptoms of malaise and some shortness of breath.  No respiratory distress.  O2 sat 95% room air.  She does have some crackles right base greater than left which may be related to old scarring based on previous CT  angiogram from 2022.  -Obtain PA and lateral chest x-ray-shows no obvious infiltrate.  This will be over read. - Given her age and increased risk with rheumatoid arthritis history in Remicade  injections recommend starting Augmentin  875 mg twice daily for 7 days.  Follow-up promptly for any fever or increasing shortness of breath  No follow-ups on file.    [name], MD     [1]  Allergies Allergen Reactions   Methotrexate And Trimetrexate    "

## 2024-06-29 NOTE — Patient Instructions (Signed)
 Follow up for any fever or increased shortness of breath.   We will call you when CXR has been over read.

## 2024-07-08 ENCOUNTER — Ambulatory Visit: Payer: Self-pay | Admitting: Family Medicine
